# Patient Record
Sex: Male | Born: 1995
Health system: Southern US, Community
[De-identification: ages and names within clinical notes are randomized; demographics above are authoritative.]

## PROBLEM LIST (undated history)

## (undated) DIAGNOSIS — F329 Major depressive disorder, single episode, unspecified: Secondary | ICD-10-CM

## (undated) DIAGNOSIS — F32A Depression, unspecified: Secondary | ICD-10-CM

## (undated) DIAGNOSIS — F909 Attention-deficit hyperactivity disorder, unspecified type: Secondary | ICD-10-CM

## (undated) DIAGNOSIS — S0291XA Unspecified fracture of skull, initial encounter for closed fracture: Secondary | ICD-10-CM

## (undated) HISTORY — DX: Depression, unspecified: F32.A

## (undated) HISTORY — DX: Major depressive disorder, single episode, unspecified: F32.9

## (undated) HISTORY — DX: Unspecified fracture of skull, initial encounter for closed fracture: S02.91XA

---
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
## 2015-08-18 ENCOUNTER — Emergency Department (HOSPITAL_BASED_OUTPATIENT_CLINIC_OR_DEPARTMENT_OTHER): Payer: BLUE CROSS/BLUE SHIELD

## 2015-08-18 ENCOUNTER — Emergency Department (HOSPITAL_BASED_OUTPATIENT_CLINIC_OR_DEPARTMENT_OTHER)
Admission: EM | Admit: 2015-08-18 | Discharge: 2015-08-19 | Disposition: A | Discharge status: Home / Self Care | Payer: BLUE CROSS/BLUE SHIELD | Attending: Emergency Medicine | Admitting: Emergency Medicine

## 2015-08-18 ENCOUNTER — Encounter (HOSPITAL_BASED_OUTPATIENT_CLINIC_OR_DEPARTMENT_OTHER): Payer: Self-pay

## 2015-08-18 DIAGNOSIS — F909 Attention-deficit hyperactivity disorder, unspecified type: Secondary | ICD-10-CM | POA: Diagnosis not present

## 2015-08-18 DIAGNOSIS — M79672 Pain in left foot: Secondary | ICD-10-CM

## 2015-08-18 DIAGNOSIS — Z791 Long term (current) use of non-steroidal anti-inflammatories (NSAID): Secondary | ICD-10-CM | POA: Diagnosis not present

## 2015-08-18 DIAGNOSIS — M25572 Pain in left ankle and joints of left foot: Secondary | ICD-10-CM | POA: Diagnosis not present

## 2015-08-18 DIAGNOSIS — S99922A Unspecified injury of left foot, initial encounter: Secondary | ICD-10-CM | POA: Diagnosis not present

## 2015-08-18 DIAGNOSIS — S99912A Unspecified injury of left ankle, initial encounter: Secondary | ICD-10-CM | POA: Diagnosis not present

## 2015-08-18 MED ORDER — NAPROXEN 250 MG PO TABS
500.0000 mg | ORAL_TABLET | Freq: Once | ORAL | Status: AC
  Administered 2015-08-18: 500 mg via ORAL
  Filled 2015-08-18: qty 2
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
## 2015-08-18 NOTE — ED Notes (Signed)
Pt reports left ankle pain "off and on" for a while. Reports increasing in frequency. Pt reports torn ligament 4 years ago.

## 2015-08-18 NOTE — ED Notes (Signed)
MD at bedside. 

## 2015-08-19 MED ORDER — NAPROXEN 500 MG PO TABS: 500.0000 mg | ORAL_TABLET | Freq: Two times a day (BID) | ORAL | Status: DC

## 2015-08-19 MED FILL — NAPROXEN 500 MG TABLET: 500 | 15 days supply | Qty: 30 | Fill #0

## 2015-08-19 NOTE — ED Notes (Signed)
Pt given d/c instructions as per chart. Verbalizes understanding. No questions. Rx x 1 

## 2015-08-19 NOTE — Discharge Instructions (Signed)
Medications: Naproxen  Treatment: Take naproxen twice daily for your pain. Ice your heel 3-4 times daily alternating 20 minutes on, 20 minutes off. Use crutches as needed for comfort. Please be careful to not injure yourself again if you use her crutches.  Follow-up: Please follow-up with Dr. Pearletha ForgeHudnall for further evaluation and treatment of your pain. Please return to emergency department if you develop any new or worsening symptoms.

## 2015-09-17 ENCOUNTER — Other Ambulatory Visit: Payer: Self-pay | Admitting: Internal Medicine

## 2015-09-17 ENCOUNTER — Encounter: Payer: Self-pay | Admitting: Internal Medicine

## 2015-09-17 ENCOUNTER — Ambulatory Visit (INDEPENDENT_AMBULATORY_CARE_PROVIDER_SITE_OTHER): Payer: Managed Care, Other (non HMO) | Admitting: Internal Medicine

## 2015-09-17 VITALS — BP 106/66 | HR 66 | Temp 98.3°F | Ht 70.25 in | Wt 125.2 lb

## 2015-09-17 DIAGNOSIS — F329 Major depressive disorder, single episode, unspecified: Secondary | ICD-10-CM | POA: Diagnosis not present

## 2015-09-17 DIAGNOSIS — F32A Depression, unspecified: Secondary | ICD-10-CM

## 2015-09-17 NOTE — Progress Notes (Signed)
Pre visit review using our clinic review tool, if applicable. No additional management support is needed unless otherwise documented below in the visit note. 

## 2015-09-17 NOTE — Patient Instructions (Addendum)
Please go to the lab: Provide a urine sample  You need help within the next few days: Need to see a psychiatrist for medication Needs to find a counselor for long-term counseling Please call the [facility], see below You may also like to  contact some of the local psychiatrists.  [facility] [address], [place], [place] [address]  [phone]   If you have more suicidal ideas: go to the ER  If you are unable to get help within the next few days, please call the office

## 2015-09-17 NOTE — Progress Notes (Signed)
Subjective:    Patient ID: Seth Hill, male    DOB: 1995/05/04, 20 y.o.   MRN: [id number]  DOS:  09/17/2015 Type of visit - description : Acute visit, chief complaint depression; here with his father Interval history: The patient reports a long history of mild, on and off depression for a while, symptoms are definitely increasing for the last 3 months and particularly for the last few weeks. Feels down, like his life is not going anywhere . His ADD is untreated, has challenges with finances and opportunities to advance. Lost a friend to suicide few weeks ago, since then has been unable to sleep well. Father reports anger issues. When asked, admits to suicidal ideas on and off, some plans. Denies having acces to a gun, no suicidal thoughts in the last 24 hours. No   personal or family history of suicidal attempts  Review of Systems Denies  EtOH or drugs other than marijuana Has gained a few pounds lately No episodes of mania  Past Medical History  Diagnosis Date  . Attention deficit disorder with hyperactivity(314.01)   . Fracture, skull (HCC)     ~ 4-5 y/o    Past Surgical History  Procedure Laterality Date  . Mandible surgery  2008    jaw Fx      Social History   Social History  . Marital Status: Single    Spouse Name: N/A  . Number of Children: 0  . Years of Education: N/A   Occupational History  . works , Holiday representative, Marine scientist    Social History Main Topics  . Smoking status: Current Every Day Smoker  . Smokeless tobacco: Never Used     Comment: 1/3 ppd   . Alcohol Use: No     Comment: denies   . Drug Use: Yes     Comment: Marijuana, x3/week; denies other drugs  . Sexual Activity: Not on file   Other Topics Concern  . Not on file   Social History Narrative   Household-- F, M, B and sister    Did not finish HS, 11 grade only           Medication List       This list is accurate as of: 09/17/15 11:59 PM.  Always use your most recent med  list.               naproxen 500 MG tablet  Commonly known as:  NAPROSYN  Take 1 tablet (500 mg total) by mouth 2 (two) times daily.           Objective:   Physical Exam BP 106/66 mmHg  Pulse 66  Temp(Src) 98.3 F (36.8 C) (Oral)  Ht 5' 10.25" (1.784 m)  Wt 125 lb 4 oz (56.813 kg)  BMI 17.85 kg/m2  SpO2 94%  General:   Well developed, well nourished . NAD.  Neck: No  thyromegaly  HEENT:  Normocephalic . Face symmetric, atraumatic Lungs:  CTA B Normal respiratory effort, no intercostal retractions, no accessory muscle use. Heart: RRR,  no murmur.  No pretibial edema bilaterally  Abdomen:  Not distended, soft, non-tender. No rebound or rigidity.   Skin: Exposed areas without rash. Not pale. Not jaundice Neurologic:  alert & oriented X3.  Speech normal, gait appropriate for age and unassisted Strength symmetric and appropriate for age.  Psych: Cognition and judgment appear intact.  Cooperative with normal attention span and concentration.  Behavior appropriate. Only slightly anxious/depressed appearing. Certainly in no distress.  Assessment & Plan:   Assessment ADD with hyperactivity H/o Skull fracture, age ~ 20 y/o H/o lead poisoning   Plan: Severe depression: no bipolar features, untreated chronic ADD. The patient is clearly  depressed, PHQ-9 scored  24 which is confirmatory, he is also having suicidal thoughts. He needs prompt psychiatric care, contact numbers provided, I am somewhat reluctant to prescribe SSRIs due to the risk of increased suicidal ideas. Plan:  Urine sample for drug testing, to see a psychiatrist and a counselor ASAP. See instructions. If is unable to get prompt help, I'm willing to prescribe a low-dose of SSRI such as Lexapro 5 mg, the patient and his father who is here today, they are  aware of the increased risk of suicide w/ SSRIs and I was told he will be closely monitored by family . At some point he will need to address his  ADD.

## 2015-09-18 DIAGNOSIS — Z09 Encounter for follow-up examination after completed treatment for conditions other than malignant neoplasm: Secondary | ICD-10-CM | POA: Insufficient documentation

## 2015-09-18 NOTE — Assessment & Plan Note (Signed)
Severe depression: no bipolar features, untreated chronic ADD. The patient is clearly  depressed, PHQ-9 scored  24 which is confirmatory, he is also having suicidal thoughts. He needs prompt psychiatric care, contact numbers provided, I am somewhat reluctant to prescribe SSRIs due to the risk of increased suicidal ideas. Plan:  Urine sample for drug testing, to see a psychiatrist and a counselor ASAP. See instructions. If is unable to get prompt help, I'm willing to prescribe a low-dose of SSRI such as Lexapro 5 mg, the patient and his father who is here today, they are  aware of the increased risk of suicide w/ SSRIs and I was told he will be closely monitored by family . At some point he will need to address his ADD.

## 2015-09-21 LAB — PAIN MGMT, PROFILE 1 W/O CONF, U
Amphetamines: NEGATIVE ng/mL (ref <500)
BENZODIAZEPINES: NEGATIVE ng/mL (ref <100)
Barbiturates: NEGATIVE ng/mL (ref <300)
COCAINE METABOLITE: NEGATIVE ng/mL (ref <150)
CREATININE: 91.5 mg/dL (ref >=20.0)
MARIJUANA METABOLITE: POSITIVE ng/mL — AB (ref <20)
Methadone Metabolite: NEGATIVE ng/mL (ref <100)
OPIATES: NEGATIVE ng/mL (ref <100)
OXYCODONE: NEGATIVE ng/mL (ref <100)
Oxidant: NEGATIVE ug/mL (ref <200)
PHENCYCLIDINE: NEGATIVE ng/mL (ref <25)
pH: 6.97 (ref 4.5–9.0)

## 2015-10-08 ENCOUNTER — Telehealth: Payer: Self-pay | Admitting: Internal Medicine

## 2015-10-08 NOTE — Telephone Encounter (Signed)
thx

## 2015-10-08 NOTE — Telephone Encounter (Signed)
LMOM for Pt's father to return call at his convenience. Pt also has CPE scheduled for 10/09/2015 at 3:30PM.

## 2015-10-08 NOTE — Telephone Encounter (Signed)
Was recently seen with severe depression, please check on the patient: Was he able to see psychiatry? Is he doing better, worse? Let me know

## 2015-10-09 ENCOUNTER — Encounter: Payer: Self-pay | Admitting: Internal Medicine

## 2015-10-09 ENCOUNTER — Ambulatory Visit (INDEPENDENT_AMBULATORY_CARE_PROVIDER_SITE_OTHER): Payer: BLUE CROSS/BLUE SHIELD | Admitting: Internal Medicine

## 2015-10-09 VITALS — BP 108/72 | HR 75 | Temp 98.1°F | Resp 12 | Ht 70.0 in | Wt 126.0 lb

## 2015-10-09 DIAGNOSIS — Z114 Encounter for screening for human immunodeficiency virus [HIV]: Secondary | ICD-10-CM

## 2015-10-09 DIAGNOSIS — Z0000 Encounter for general adult medical examination without abnormal findings: Secondary | ICD-10-CM | POA: Diagnosis not present

## 2015-10-09 DIAGNOSIS — Z23 Encounter for immunization: Secondary | ICD-10-CM

## 2015-10-09 MED ORDER — ESCITALOPRAM OXALATE 10 MG PO TABS: 10.0000 mg | ORAL_TABLET | Freq: Every day | ORAL | 0 refills | Status: DC

## 2015-10-09 MED FILL — ESCITALOPRAM 10 MG TABLET: 10 | 30 days supply | Qty: 30 | Fill #0

## 2015-10-09 NOTE — Patient Instructions (Signed)
GO TO THE FRONT DESK Schedule your next appointment for a  routine checkup in 4 weeks  Schedule labs to be done within the next 2 days  Start Lexapro, one tablet every night. Watch for side effects including suicidal thoughts.   Safe Sex Safe sex is about reducing the risk of giving or getting a sexually transmitted disease (STD). STDs are spread through sexual contact involving the genitals, mouth, or rectum. Some STDs can be cured and others cannot. Safe sex can also prevent unintended pregnancies.  WHAT ARE SOME SAFE SEX PRACTICES?  Limit your sexual activity to only one partner who is having sex with only you.  Talk to your partner about his or her past partners, past STDs, and drug use.  Use a condom every time you have sexual intercourse. This includes vaginal, oral, and anal sexual activity. Both females and males should wear condoms during oral sex. Only use latex or polyurethane condoms and water-based lubricants. Using petroleum-based lubricants or oils to lubricate a condom will weaken the condom and increase the chance that it will break. The condom should be in place from the beginning to the end of sexual activity. Wearing a condom reduces, but does not completely eliminate, your risk of getting or giving an STD. STDs can be spread by contact with infected body fluids and skin.  Get vaccinated for hepatitis B and HPV.  Avoid alcohol and recreational drugs, which can affect your judgment. You may forget to use a condom or participate in high-risk sex.  For females, avoid douching after sexual intercourse. Douching can spread an infection farther into the reproductive tract.  Check your body for signs of sores, blisters, rashes, or unusual discharge. See your health care provider if you notice any of these signs.  Avoid sexual contact if you have symptoms of an infection or are being treated for an STD. If you or your partner has herpes, avoid sexual contact when blisters are  present. Use condoms at all other times.  If you are at risk of being infected with HIV, it is recommended that you take a prescription medicine daily to prevent HIV infection. This is called pre-exposure prophylaxis (PrEP). You are considered at risk if:  You are a man who has sex with other men (MSM).  You are a heterosexual man or woman who is sexually active with more than one partner.  You take drugs by injection.  You are sexually active with a partner who has HIV.  Talk with your health care provider about whether you are at high risk of being infected with HIV. If you choose to begin PrEP, you should first be tested for HIV. You should then be tested every 3 months for as long as you are taking PrEP.  See your health care provider for regular screenings, exams, and tests for other STDs. Before having sex with a new partner, each of you should be screened for STDs and should talk about the results with each other. WHAT ARE THE BENEFITS OF SAFE SEX?   There is less chance of getting or giving an STD.  You can prevent unwanted or unintended pregnancies.  By discussing safe sex concerns with your partner, you may increase feelings of intimacy, comfort, trust, and honesty between the two of you.   This information is not intended to replace advice given to you by your health care provider. Make sure you discuss any questions you have with your health care provider.   Document Released: 04/02/2004  Document Revised: 03/16/2014 Document Reviewed: 08/17/2011 Elsevier Interactive Patient Education [employer]     Testicular Self-Exam A self-examination of your testicles involves looking at and feeling your testicles for abnormal lumps or swelling. Several things can cause swelling, lumps, or pain in your testicles. Some of these causes are:  Injuries.  Inflammation.  Infection.  Accumulation of fluids around your testicle (hydrocele).  Twisted testicles (testicular  torsion).  Testicular cancer. Self-examination of the testicles and groin areas may be advised if you are at risk for testicular cancer. Risks for testicular cancer include:  An undescended testicle (cryptorchidism).  A history of previous testicular cancer.  A family history of testicular cancer. The testicles are easiest to examine after warm baths or showers and are more difficult to examine when you are cold. This is because the muscles attached to the testicles retract and pull them up higher or into the abdomen. Follow these steps while you are standing:  Hold your penis away from your body.  Roll one testicle between your thumb and forefinger, feeling the entire testicle.  Roll the other testicle between your thumb and forefinger, feeling the entire testicle. Feel for lumps, swelling, or discomfort. A normal testicle is egg shaped and feels firm. It is smooth and not tender. The spermatic cord can be felt as a firm spaghetti-like cord at the back of your testicle. It is also important to examine the crease between the front of your leg and your abdomen. Feel for any bumps that are tender. These could be enlarged lymph nodes.    This information is not intended to replace advice given to you by your health care provider. Make sure you discuss any questions you have with your health care provider.   Document Released: 06/01/2000 Document Revised: 10/26/2012 Document Reviewed: 08/15/2012 Elsevier Interactive Patient Education [employer]

## 2015-10-09 NOTE — Progress Notes (Signed)
Pre visit review using our clinic review tool, if applicable. No additional management support is needed unless otherwise documented below in the visit note. 

## 2015-10-09 NOTE — Assessment & Plan Note (Addendum)
Tdap 2014 Menactra and Bexero today Labs: Will come back in the morning for an FLP, TSH, HIV Counseled about: Diet, exercise, healthy eating, safe driving, safe sex, avoidance of tobacco- alcohol-drugs ; needs to see dentist regulalrly.

## 2015-10-09 NOTE — Progress Notes (Signed)
Subjective:    Patient ID: Seth Hill, male    DOB: 13-Jan-1996, 20 y.o.   MRN: [id number]  DOS:  10/09/2015 Type of visit - description : CPX Interval history: Since the last time he was here with severe depression, did not seek counseling or see a psychiatrist however he is doing better. No suicidal ideas lately. The father who is here confirms above, reports that he "got rid off a couple of people in his life" and make a huge difference   Review of Systems  Constitutional: No fever. No chills. No unexplained wt changes. No unusual sweats  HEENT: No dental problems, no ear discharge, no facial swelling, no voice changes. No eye discharge, no eye  redness , no  intolerance to light   Respiratory: No wheezing , no  difficulty breathing. No cough , no mucus production  Cardiovascular: No CP, no leg swelling , no  Palpitations  GI: no nausea, no vomiting, no diarrhea , no  abdominal pain.  No blood in the stools. No dysphagia, no odynophagia    Endocrine: No polyphagia, no polyuria , no polydipsia  GU: No dysuria, gross hematuria, difficulty urinating. No urinary urgency, no frequency.  Musculoskeletal: No joint swellings or unusual aches or pains  Skin: No change in the color of the skin, palor , no  Rash  Allergic, immunologic: No environmental allergies , no  food allergies  Neurological: No dizziness no  syncope. No headaches. No diplopia, no slurred, no slurred speech, no motor deficits, no facial  Numbness  Hematological: No enlarged lymph nodes, no easy bruising , no unusual bleedings  Psychiatry: No suicidal ideas, no hallucinations, no beavior problems, no confusion.   Past Medical History:  Diagnosis Date  . Attention deficit disorder with hyperactivity(314.01)   . Depression   . Fracture, skull (HCC)    ~ 4-5 y/o    Past Surgical History:  Procedure Laterality Date  . MANDIBLE SURGERY  2008   jaw Fx      Social History   Social History  .  Marital status: Single    Spouse name: N/A  . Number of children: 0  . Years of education: N/A   Occupational History  . looking for a job    Social History Main Topics  . Smoking status: Current Every Day Smoker  . Smokeless tobacco: Never Used     Comment: 1/3 ppd   . Alcohol use No     Comment: denies   . Drug use:      Comment: Marijuana, x3/week; nothingh layely ;denies other drugs  . Sexual activity: Not on file   Other Topics Concern  . Not on file   Social History Narrative   Household-- F, M, B and sister    Did not finish HS, 11 grade only        Family History  Problem Relation Age of Onset  . Macular degeneration Father   . Diabetes Father   . Hyperlipidemia Father   . Brain cancer Paternal Grandfather     brain tumor  . CAD Neg Hx        Medication List       Accurate as of 10/09/15 11:59 PM. Always use your most recent med list.          escitalopram 10 MG tablet Commonly known as:  LEXAPRO Take 1 tablet (10 mg total) by mouth daily.   naproxen 500 MG tablet Commonly known as:  NAPROSYN Take 1  tablet (500 mg total) by mouth 2 (two) times daily.          Objective:   Physical Exam BP 108/72 (BP Location: Left Arm, Patient Position: Sitting, Cuff Size: Small)   Pulse 75   Temp 98.1 F (36.7 C) (Oral)   Resp 12   Ht  (1.778 m)   Wt 126 lb (57.2 kg)   SpO2 97%   BMI 18.08 kg/m   General:   Well developed, mildly underweight but healthy-appearing . NAD.  Neck: No  thyromegaly  HEENT:  Normocephalic . Face symmetric, atraumatic Lungs:  CTA B Normal respiratory effort, no intercostal retractions, no accessory muscle use. Heart: RRR,  no murmur.  No pretibial edema bilaterally  Abdomen:  Not distended, soft, non-tender. No rebound or rigidity.   Skin: Exposed areas without rash. Not pale. Not jaundice Neurologic:  alert & oriented X3.  Speech normal, gait appropriate for age and unassisted Strength symmetric and  appropriate for age.  Psych: Cognition and judgment appear intact.  Cooperative with normal attention span and concentration.  Behavior appropriate. No anxious or depressed appearing. Much improved compared to the last time, he is smiling, back to  normal    Assessment & Plan:    Assessment ADD with hyperactivity (see note from 10-2015) H/o Skull fracture, age ~ 20 y/o H/o lead poisoning   PLAN: ADD: Long history of ADHD, treated with Ritalin when he was in first and second grade, parents d/c meds d/t weight loss. At this point he continue with sx, discussed  non-stimulant treatment, patient is quite hesitant. He will call if interested. Severe depression: improved, according to the father he  "got rid off a couple of people in his life" and that has helped. No suicidal ideas at this point. He self medicated with Lexapro 1 tablet and he subjectively felt better. I now feel comfortable prescribing SSRIs since he is not suicidal anymore, we agreed to start Lexapro, s/e discussed . RTC one month

## 2015-10-10 ENCOUNTER — Other Ambulatory Visit: Payer: Self-pay

## 2015-10-10 NOTE — Assessment & Plan Note (Signed)
ADD: Long history of ADHD, treated with Ritalin when he was in first and second grade, parents d/c meds d/t weight loss. At this point he continue with sx, discussed  non-stimulant treatment, patient is quite hesitant. He will call if interested. Severe depression: improved, according to the father he  "got rid off a couple of people in his life" and that has helped. No suicidal ideas at this point. He self medicated with Lexapro 1 tablet and he subjectively felt better. I now feel comfortable prescribing SSRIs since he is not suicidal anymore, we agreed to start Lexapro, s/e discussed . RTC one month

## 2015-10-11 ENCOUNTER — Other Ambulatory Visit: Payer: Managed Care, Other (non HMO)

## 2015-10-11 LAB — LIPID PANEL
Cholesterol: 166 mg/dL (ref 0–200)
HDL: 41.8 mg/dL (ref >39.00)
LDL Cholesterol: 107 mg/dL — ABNORMAL HIGH (ref 0–99)
NONHDL: 123.96
Total CHOL/HDL Ratio: 4
Triglycerides: 84 mg/dL (ref 0.0–149.0)
VLDL: 16.8 mg/dL (ref 0.0–40.0)

## 2015-10-11 LAB — TSH: TSH: 1.9 u[IU]/mL (ref 0.40–5.00)

## 2015-10-12 LAB — HIV ANTIBODY (ROUTINE TESTING W REFLEX): HIV 1&2 Ab, 4th Generation: NONREACTIVE

## 2015-10-29 ENCOUNTER — Telehealth: Payer: Self-pay

## 2015-10-29 NOTE — Telephone Encounter (Signed)
[facility] note received via fax  Call    Date:10/25/15 Time: 5:58:22pm   Caller:  Seth Hill (patient) Return number:  [phone]  Nurse:  Suzy BouchardBelinda Wells, RN  Chief Complaint:  Foot or Ankle Injury  Reason for call:  Caller is requesting Rx for Naproxen for ankle pain due to injury in which he has "torn ligaments" in right ankle.    Related visit to physician within the last 2 weeks:  n/a  Assessment type:  Verbal order/New medication order  Additional documentation:  Caller is requesting Rx for Naproxen for ankle pain due to injury in which he has "torn ligaments" in right ankle.  He last filled the prescription a month or more ago.  [facility] [place] [facility].    Enough medication until office visit:   No   Advised caller that the on call provider does not handle refills after hours.  Advised called that he can buy aleve OTC and take two of them twice daily and it is the same medication.  Advised caller that the nurse will send the message into the office for his prescription refill request.  Caller verbalized understanding.  Advised per [email or web address].      Message routed to PCP for review.  Ok to refill naproxen?  Please advise.

## 2015-10-30 MED ORDER — NAPROXEN 500 MG PO TABS: 500.0000 mg | ORAL_TABLET | Freq: Two times a day (BID) | ORAL | 0 refills | Status: DC

## 2015-10-30 NOTE — Telephone Encounter (Signed)
Okay #40, no refills, take with food to prevent  stomach irritation. If pain is persistent needs to be seen by sports medicine.

## 2015-10-30 NOTE — Telephone Encounter (Signed)
Rx sent 

## 2015-11-06 ENCOUNTER — Ambulatory Visit: Payer: Self-pay | Admitting: Internal Medicine

## 2015-11-06 DIAGNOSIS — Z0289 Encounter for other administrative examinations: Secondary | ICD-10-CM

## 2016-01-14 ENCOUNTER — Telehealth: Payer: Self-pay | Admitting: Internal Medicine

## 2016-01-14 MED ORDER — NAPROXEN 500 MG PO TABS: 500.0000 mg | ORAL_TABLET | Freq: Two times a day (BID) | ORAL | 0 refills | Status: DC

## 2016-01-14 NOTE — Telephone Encounter (Signed)
Rx sent 

## 2016-01-14 NOTE — Telephone Encounter (Signed)
Patient is requesting a refill of naproxen (NAPROSYN) 500 MG tablet Please advise   Pharmacy: [facility] [place], [place] - [id number] [address]

## 2016-01-27 MED FILL — NAPROXEN 500 MG TABLET: 500 | 20 days supply | Qty: 40 | Fill #0

## 2016-05-14 ENCOUNTER — Ambulatory Visit (INDEPENDENT_AMBULATORY_CARE_PROVIDER_SITE_OTHER): Payer: Self-pay | Admitting: Medical

## 2016-05-14 ENCOUNTER — Ambulatory Visit (HOSPITAL_BASED_OUTPATIENT_CLINIC_OR_DEPARTMENT_OTHER)
Admission: RE | Admit: 2016-05-14 | Discharge: 2016-05-14 | Disposition: A | Discharge status: Home / Self Care | Payer: Self-pay | Source: Ambulatory Visit | Attending: Medical | Admitting: Medical

## 2016-05-14 ENCOUNTER — Encounter: Payer: Self-pay | Admitting: Medical

## 2016-05-14 VITALS — BP 105/57 | HR 70 | Temp 98.2°F | Resp 16 | Ht 70.0 in | Wt 126.4 lb

## 2016-05-14 DIAGNOSIS — M25531 Pain in right wrist: Secondary | ICD-10-CM

## 2016-05-14 DIAGNOSIS — M79641 Pain in right hand: Secondary | ICD-10-CM

## 2016-05-14 DIAGNOSIS — M79631 Pain in right forearm: Secondary | ICD-10-CM | POA: Insufficient documentation

## 2016-05-14 MED ORDER — DICLOFENAC SODIUM 75 MG PO TBEC: 75.0000 mg | DELAYED_RELEASE_TABLET | Freq: Two times a day (BID) | ORAL | 0 refills | Status: DC

## 2016-05-14 NOTE — Progress Notes (Signed)
Pre visit review using our clinic review tool, if applicable. No additional management support is needed unless otherwise documented below in the visit note/SLS  

## 2016-05-14 NOTE — Progress Notes (Signed)
Subjective:    Patient ID: Seth Hill, male    DOB: 1995-11-23, 20 y.o.   MRN: [id number]  HPI  Pt in states he hurt his right wrist working on fender of vehicle. He states car rotor popped up and his wrist got twisted.   Pt is rt handed.  Accident was 2 days ago. Pt states no nsaids used. Dad gave him a gabapentin but did not help with pain.(discouraged use of others meds)     Review of Systems  Constitutional: Negative for chills, fatigue and fever.  Respiratory: Negative for cough, choking, chest tightness and wheezing.   Cardiovascular: Negative for palpitations.  Gastrointestinal: Negative for abdominal pain, constipation, diarrhea, nausea and vomiting.  Musculoskeletal: Negative for back pain.       Rt forearm, rt wrist and hand pain.  Skin: Negative for rash.  Neurological: Negative for dizziness and headaches.  Hematological: Negative for adenopathy. Does not bruise/bleed easily.  Psychiatric/Behavioral: Negative for behavioral problems and confusion.   Past Medical History:  Diagnosis Date  . Attention deficit disorder with hyperactivity(314.01)   . Depression   . Fracture, skull (HCC)    ~ 4-5 y/o     Social History   Social History  . Marital status: Single    Spouse name: N/A  . Number of children: 0  . Years of education: N/A   Occupational History  . looking for a job    Social History Main Topics  . Smoking status: Current Every Day Smoker  . Smokeless tobacco: Never Used     Comment: 1/3 ppd   . Alcohol use No     Comment: denies   . Drug use: Yes     Comment: Marijuana, x3/week; nothingh layely ;denies other drugs  . Sexual activity: Not on file   Other Topics Concern  . Not on file   Social History Narrative   Household-- F, M, B and sister    Did not finish HS, 11 grade only       Past Surgical History:  Procedure Laterality Date  . MANDIBLE SURGERY  2008   jaw Fx      Family History  Problem Relation Age of Onset    . Macular degeneration Father   . Diabetes Father   . Hyperlipidemia Father   . Brain cancer Paternal Grandfather     brain tumor  . CAD Neg Hx     No Known Allergies  Current Outpatient Prescriptions on File Prior to Visit  Medication Sig Dispense Refill  . escitalopram (LEXAPRO) 10 MG tablet Take 1 tablet (10 mg total) by mouth daily. 30 tablet 0   No current facility-administered medications on file prior to visit.     BP (!) 105/57 (BP Location: Left Arm, Patient Position: Sitting, Cuff Size: Normal)   Pulse 70   Temp 98.2 F (36.8 C) (Oral)   Resp 16   Ht 5\' 10"  (1.778 m)   Wt 126 lb 6 oz (57.3 kg)   SpO2 100%   BMI 18.13 kg/m      Objective:   Physical Exam  General- No acute distress. Pleasant patient.  Lungs- Clear, even and unlabored. Heart- regular rate and rhythm. Neurologic- CNII- XII grossly intact.  Rt forearm- dorsal aspect mild abrasion. Mid aspect tender and mild swollen. Rt wrist- dorsal wrist aspect tender.  Rt hand ventral aspect- mild abrasion 5th mettacarpal area. 5th metacarpal pain on palpation        Assessment & Plan:  [phone].  For your rt forearm pain, wrist pain and hand pain will get xrays today.  For pain and inflammation rx diclofenac.  Get wrist cock up splint and use daily.  Follow up in 7-10 days or as needed  Tidus Upchurch, Ramon DredgeEdward, [employer]F CorporationPA-C

## 2016-05-14 NOTE — Patient Instructions (Addendum)
For your rt forearm pain, wrist pain and hand pain will get xrays today.  For pain and inflammation rx diclofenac.  Get wrist cock up splint and use daily.  Follow up in 7-10 days or as needed  If no fracture and pain persisting then may need to refer to sports medicine   You could go back to work on Monday with restricted duty. Please update me if your work has restricted duty.

## 2016-05-22 MED FILL — DICLOFENAC SOD 75 MG TAB EC: 75 | 15 days supply | Qty: 30 | Fill #0

## 2016-06-29 ENCOUNTER — Encounter: Payer: Self-pay | Admitting: Medical

## 2016-06-29 ENCOUNTER — Ambulatory Visit (INDEPENDENT_AMBULATORY_CARE_PROVIDER_SITE_OTHER): Payer: Self-pay | Admitting: Medical

## 2016-06-29 VITALS — BP 105/67 | HR 88 | Temp 98.9°F | Ht 70.0 in | Wt 126.8 lb

## 2016-06-29 DIAGNOSIS — H6690 Otitis media, unspecified, unspecified ear: Secondary | ICD-10-CM

## 2016-06-29 DIAGNOSIS — J029 Acute pharyngitis, unspecified: Secondary | ICD-10-CM

## 2016-06-29 DIAGNOSIS — J301 Allergic rhinitis due to pollen: Secondary | ICD-10-CM

## 2016-06-29 LAB — POCT RAPID STREP A (OFFICE): Rapid Strep A Screen: NEGATIVE

## 2016-06-29 MED ORDER — FLUTICASONE PROPIONATE 50 MCG/ACT NA SUSP: 2.0000 | Freq: Every day | NASAL | 1 refills | Status: DC

## 2016-06-29 MED ORDER — AMOXICILLIN-POT CLAVULANATE 875-125 MG PO TABS: 1.0000 | ORAL_TABLET | Freq: Two times a day (BID) | ORAL | 0 refills | Status: DC

## 2016-06-29 MED ORDER — LEVOCETIRIZINE DIHYDROCHLORIDE 5 MG PO TABS: 5.0000 mg | ORAL_TABLET | Freq: Every evening | ORAL | 0 refills | Status: DC

## 2016-06-29 MED FILL — AMOX-CLAV 875-125 MG TABLET: 875-125 | 10 days supply | Qty: 20 | Fill #0

## 2016-06-29 NOTE — Progress Notes (Signed)
Subjective:    Patient ID: Seth Hill, male    DOB: [birth year], 21 y.o.   MRN: [id number]  HPI  Pt in with st since Saturday. Mild pain on Saturday. Worse pain on Sunday morning.(Sunday intermittent pain). Today constant sore throat.  Pt has had some nasal congestion with some sneezing. No itchy eyes.  No fever, no chills or sweats.(then states feels kinda warm at times).   When blows his nose some colored mucous.    Review of Systems  Constitutional: Positive for fever. Negative for chills and fatigue.       Maybe low grad subjective.  HENT: Positive for congestion and sore throat. Negative for ear pain, postnasal drip, sinus pain, sinus pressure, sneezing and trouble swallowing.        Pain swallowing.  Respiratory: Negative for cough, chest tightness, shortness of breath and wheezing.   Cardiovascular: Negative for chest pain and palpitations.  Gastrointestinal: Negative for abdominal pain.  Musculoskeletal: Negative for back pain and myalgias.  Skin: Negative for rash.  Neurological: Negative for dizziness, syncope, weakness and headaches.  Hematological: Negative for adenopathy. Does not bruise/bleed easily.  Psychiatric/Behavioral: Negative for behavioral problems and confusion.    Past Medical History:  Diagnosis Date  . Attention deficit disorder with hyperactivity(314.01)   . Depression   . Fracture, skull (HCC)    ~ 4-5 y/o     Social History   Social History  . Marital status: Single    Spouse name: N/A  . Number of children: 0  . Years of education: N/A   Occupational History  . looking for a job    Social History Main Topics  . Smoking status: Current Every Day Smoker  . Smokeless tobacco: Never Used     Comment: 1/3 ppd   . Alcohol use No     Comment: denies   . Drug use: Yes     Comment: Marijuana, x3/week; nothingh layely ;denies other drugs  . Sexual activity: Not on file   Other Topics Concern  . Not on file   Social History  Narrative   Household-- F, M, B and sister    Did not finish HS, 11 grade only       Past Surgical History:  Procedure Laterality Date  . MANDIBLE SURGERY  2008   jaw Fx      Family History  Problem Relation Age of Onset  . Macular degeneration Father   . Diabetes Father   . Hyperlipidemia Father   . Brain cancer Paternal Grandfather     brain tumor  . CAD Neg Hx     No Known Allergies  No current outpatient prescriptions on file prior to visit.   No current facility-administered medications on file prior to visit.     BP 105/67 (BP Location: Right Arm, Patient Position: Sitting, Cuff Size: Normal)   Pulse 88   Temp 98.9 F (37.2 C) (Oral)   Ht  (1.778 m)   Wt 126 lb 12.8 oz (57.5 kg)   BMI 18.19 kg/m       Objective:   Physical Exam  General  Mental Status - Alert. General Appearance - Well groomed. Not in acute distress.  Skin Rashes- No Rashes.  HEENT Head- Normal. Ear Auditory Canal - Left- Normal. Right - Normal.Tympanic Membrane- Left- Normal. Right- rt tm red Eye Sclera/Conjunctiva- Left- Normal. Right- Normal. Nose & Sinuses Nasal Mucosa- Left-  Boggy and Congested. Right-  Boggy and  Congested.Bilateral no maxillary  and no frontal sinus pressure. Mouth & Throat Lips: Upper Lip- Normal: no dryness, cracking, pallor, cyanosis, or vesicular eruption. Lower Lip-Normal: no dryness, cracking, pallor, cyanosis or vesicular eruption. Buccal Mucosa- Bilateral- No Aphthous ulcers. Oropharynx- No Discharge or Erythema. +pnd Tonsils: Characteristics- Bilateral-  Very bright  Erythema an mild Congestion. Size/Enlargement- Bilateral- No enlargement. Discharge- bilateral-None.  Neck Neck- Supple. No Masses.   Chest and Lung Exam Auscultation: Breath Sounds:-Clear even and unlabored.  Cardiovascular Auscultation:Rythm- Regular, rate and rhythm. Murmurs & Other Heart Sounds:Ausculatation of the heart reveal- No Murmurs.  Lymphatic Head &  Neck General Head & Neck Lymphatics: Bilateral: Description- moderate enlarged submandibular nodes       Assessment & Plan:  Your strep test was negative. However, your physical exam and clinical presentation is suspicious for strep and it is important to note that rapid strep test can be falsely negative. So I am going to give you augmentin  antibiotic today based on your exam and clinical presentation.  Augmentin has coverage for most ear infection. Your rt tm has dull infected appearance.  Some allergy symptoms as well. Will rx flonase for nasal congestion and xyzal.  Rest hydrate, tylenol for fever, and warm salt water gargles.   Follow up in 7 days or as needed.   Alexzia Kasler, Ramon Dredge, PA-C

## 2016-06-29 NOTE — Patient Instructions (Signed)
Your strep test was negative. However, your physical exam and clinical presentation is suspicious for strep and it is important to note that rapid strep test can be falsely negative. So I am going to give you augmentin  antibiotic today based on your exam and clinical presentation.  Augmentin has coverage for most ear infection. Your rt tm has dull infected appearance.  Some allergy symptoms as well. Will rx flonase for nasal congestion and xyzal.  Rest hydrate, tylenol for fever, and warm salt water gargles.   Follow up in 7 days or as needed.

## 2016-06-29 NOTE — Progress Notes (Signed)
Pre visit review using our clinic review tool, if applicable. No additional management support is needed unless otherwise documented below in the visit note. 

## 2016-11-18 ENCOUNTER — Encounter (HOSPITAL_BASED_OUTPATIENT_CLINIC_OR_DEPARTMENT_OTHER): Payer: Self-pay

## 2016-11-18 ENCOUNTER — Emergency Department (HOSPITAL_BASED_OUTPATIENT_CLINIC_OR_DEPARTMENT_OTHER)
Admission: EM | Admit: 2016-11-18 | Discharge: 2016-11-18 | Disposition: A | Discharge status: Home / Self Care | Payer: 59 | Attending: Emergency Medicine | Admitting: Emergency Medicine

## 2016-11-18 DIAGNOSIS — R05 Cough: Secondary | ICD-10-CM

## 2016-11-18 DIAGNOSIS — R0982 Postnasal drip: Secondary | ICD-10-CM

## 2016-11-18 DIAGNOSIS — F17210 Nicotine dependence, cigarettes, uncomplicated: Secondary | ICD-10-CM | POA: Insufficient documentation

## 2016-11-18 DIAGNOSIS — J029 Acute pharyngitis, unspecified: Secondary | ICD-10-CM

## 2016-11-18 DIAGNOSIS — R059 Cough, unspecified: Secondary | ICD-10-CM

## 2016-11-18 DIAGNOSIS — J069 Acute upper respiratory infection, unspecified: Secondary | ICD-10-CM | POA: Diagnosis not present

## 2016-11-18 LAB — RAPID STREP SCREEN (MED CTR MEBANE ONLY): STREPTOCOCCUS, GROUP A SCREEN (DIRECT): NEGATIVE

## 2016-11-18 NOTE — ED Provider Notes (Signed)
[facility]-EMERGENCY DEPT Provider Note   CSN: [id number] Arrival date & time: 11/18/16  1806     History   Chief Complaint Chief Complaint  Patient presents with  . Sore Throat    HPI Seth Hill is a 21 y.o. male who presents today with cough, sore throat that began yesterday afternoon. He reports that he does smoke about a third of a pack a day for 3 years.  Denies any fevers or chills, no shortness of breath. He does endorse nasal congestion and postnasal drainage.   HPI  Past Medical History:  Diagnosis Date  . Attention deficit disorder with hyperactivity(314.01)   . Depression   . Fracture, skull (HCC)    ~ 4-5 y/o    Patient Active Problem List   Diagnosis Date Noted  . PCP NOTES >>>>>>>>>>>>>> 09/18/2015  . Annual physical exam 12/13/2012  . ADHD (attention deficit hyperactivity disorder) 12/13/2012    Past Surgical History:  Procedure Laterality Date  . MANDIBLE SURGERY  2008   jaw Fx         Home Medications    Prior to Admission medications   Not on File    Family History Family History  Problem Relation Age of Onset  . Macular degeneration Father   . Diabetes Father   . Hyperlipidemia Father   . Brain cancer Paternal Grandfather        brain tumor  . CAD Neg Hx     Social History Social History  Substance Use Topics  . Smoking status: Current Every Day Smoker    Types: Cigarettes  . Smokeless tobacco: Never Used     Comment: 1/3 ppd   . Alcohol use No     Allergies   Patient has no known allergies.   Review of Systems Review of Systems  HENT: Positive for congestion, rhinorrhea and sore throat. Negative for ear pain and sinus pain.   Respiratory: Positive for cough. Negative for shortness of breath.      Physical Exam Updated Vital Signs BP 114/70 (BP Location: Left Arm)   Pulse 66   Temp 98.8 F (37.1 C) (Oral)   Resp 16   Ht  (1.854 m)   Wt 57.6 kg (127 lb)   SpO2 99%   BMI 16.76 kg/m   Physical  Exam  Constitutional: He appears well-developed and well-nourished.  HENT:  Head: Normocephalic and atraumatic.  Right Ear: Tympanic membrane, external ear and ear canal normal.  Left Ear: Tympanic membrane, external ear and ear canal normal.  Nose: Rhinorrhea present. Right sinus exhibits no maxillary sinus tenderness and no frontal sinus tenderness. Left sinus exhibits no maxillary sinus tenderness and no frontal sinus tenderness.  Mouth/Throat: Uvula is midline, oropharynx is clear and moist and mucous membranes are normal. No trismus in the jaw. No oropharyngeal exudate. No tonsillar exudate.  Cardiovascular: Normal rate and regular rhythm.   Pulmonary/Chest: Effort normal and breath sounds normal. No respiratory distress.  Lymphadenopathy:    He has no cervical adenopathy.  Neurological: He is alert.  Skin: Skin is warm and dry. He is not diaphoretic.  Nursing note and vitals reviewed.    ED Treatments / Results  Labs (all labs ordered are listed, but only abnormal results are displayed) Labs Reviewed  RAPID STREP SCREEN (NOT AT [facility])  CULTURE, GROUP A STREP [facility])    EKG  EKG Interpretation None       Radiology No results found.  Procedures Procedures (including critical care time)  Medications Ordered in ED Medications - No data to display   Initial Impression / Assessment and Plan / ED Course  I have reviewed the triage vital signs and the nursing notes.  Pertinent labs & imaging results that were available during my care of the patient were reviewed by me and considered in my medical decision making (see chart for details).    Patients symptoms are consistent with URI, likely viral etiology. Discussed that antibiotics are not indicated for viral infections. Pt will be discharged with symptomatic treatment.  Verbalizes understanding and is agreeable with plan. Pt is hemodynamically stable & in NAD prior to dc.   Final Clinical Impressions(s) / ED Diagnoses     Final diagnoses:  Sore throat  Cough  Post-nasal drainage  Upper respiratory tract infection, unspecified type    New Prescriptions There are no discharge medications for this patient.    Cristina GongHammond, Doyle Tegethoff W, Cordelia Poche-C 11/19/16 Jodelle Gross2001    Gold[place], Scott, MD 11/20/16 1248

## 2016-11-18 NOTE — ED Triage Notes (Signed)
C/o sore throat x 2 days-NAD-steady gait 

## 2016-11-18 NOTE — ED Notes (Signed)
Pt verbalizes understanding of d/c instructions and denies any further needs at this time. 

## 2016-11-18 NOTE — Discharge Instructions (Signed)

## 2016-11-21 LAB — CULTURE, GROUP A STREP (THRC)

## 2016-12-21 ENCOUNTER — Ambulatory Visit (INDEPENDENT_AMBULATORY_CARE_PROVIDER_SITE_OTHER): Payer: BLUE CROSS/BLUE SHIELD | Admitting: Internal Medicine

## 2016-12-21 ENCOUNTER — Encounter: Payer: Self-pay | Admitting: Internal Medicine

## 2016-12-21 VITALS — BP 118/76 | HR 66 | Temp 97.6°F | Resp 14 | Ht 70.0 in | Wt 132.1 lb

## 2016-12-21 DIAGNOSIS — K047 Periapical abscess without sinus: Secondary | ICD-10-CM

## 2016-12-21 MED ORDER — AMOXICILLIN-POT CLAVULANATE 875-125 MG PO TABS
1.0000 | ORAL_TABLET | Freq: Two times a day (BID) | ORAL | 0 refills | Status: DC
Start: 2016-12-21 — End: 2018-06-10

## 2016-12-21 NOTE — Assessment & Plan Note (Signed)
Dental infection: Right-sided jaw pain likely due to a dental infection, rx Augmentin, heating pad or ice, ibuprofen as needed, GI precautions discussed, see the dentist ASAP. See instructions.

## 2016-12-21 NOTE — Progress Notes (Signed)
   Subjective:    Patient ID: Seth Hill, male    DOB: Dec 02, 1995, 20 y.o.   MRN: [id number]  DOS:  12/21/2016 Type of visit - description :  Acute visit, here with his father Interval history: One-week history of right jaw pain, R side, under the jaw line. Pain increased for the last 2-3 days and now the whole area around the jaw on the right year is hurting History of bilateral jaw fracture with plate placement when he was  21 years old. He has some dental pain at the right lower denture.  Review of Systems No fever chills No classic sore throat from a URI. No fever chills Pain doesn't increase with chewing.  Past Medical History:  Diagnosis Date  . Attention deficit disorder with hyperactivity(314.01)   . Depression   . Fracture, skull (HCC)    ~ 4-5 y/o    Past Surgical History:  Procedure Laterality Date  . MANDIBLE SURGERY  2008   jaw Fx      Social History   Social History  . Marital status: Single    Spouse name: N/A  . Number of children: 0  . Years of education: N/A   Occupational History  . looking for a job    Social History Main Topics  . Smoking status: Current Every Day Smoker    Types: Cigarettes  . Smokeless tobacco: Never Used     Comment: 1/3 ppd   . Alcohol use No  . Drug use: Yes    Types: Marijuana  . Sexual activity: Not on file   Other Topics Concern  . Not on file   Social History Narrative   Household-- F, M, B and sister    Did not finish HS, 11 grade only         Allergies as of 12/21/2016   No Known Allergies     Medication List    as of 12/21/2016  9:06 AM   You have not been prescribed any medications.        Objective:   Physical Exam BP 118/76 (BP Location: Left Arm, Patient Position: Sitting, Cuff Size: Small)   Pulse 66   Temp 97.6 F (36.4 C) (Oral)   Resp 14   Ht  (1.778 m)   Wt 132 lb 2 oz (59.9 kg)   SpO2 97%   BMI 18.96 kg/m  General:   Well developed, well nourished . NAD.    HEENT:  Normocephalic . Face symmetric, atraumatic. TMs normal, nose not congested. TMJ: No click, nontender. Mouth: The posterior right lower molars are in very bad condition, no swelling of the gum or abscess but he certainly TTP around those 2 pieces. Throat symmetric Neck: Few submandibular LADs bilaterally, one of them at the right side is more than 1 cm  Skin: Not pale. Not jaundice Neurologic:  alert & oriented X3.  Speech normal, gait appropriate for age and unassisted Psych--  Cognition and judgment appear intact.  Cooperative with normal attention span and concentration.  Behavior appropriate. No anxious or depressed appearing.      Assessment & Plan:   Assessment ADD with hyperactivity (see note from 10-2015) H/o Skull fracture, age ~ 21 y/o H/o lead poisoning   PLAN: Dental infection: Right-sided jaw pain likely due to a dental infection, rx Augmentin, heating pad or ice, ibuprofen as needed, GI precautions discussed, see the dentist ASAP. See instructions.

## 2016-12-21 NOTE — Progress Notes (Signed)
Pre visit review using our clinic review tool, if applicable. No additional management support is needed unless otherwise documented below in the visit note. 

## 2016-12-21 NOTE — Patient Instructions (Signed)
Augmentin as prescribed for one week  See your dentist as soon as possible  Call anytime if severe symptoms, severe swelling.  IBUPROFEN (Advil or Motrin) 200 mg 2 tablets every 6 hours as needed for pain.  Always take it with food because may cause gastritis and ulcers.  If you notice nausea, stomach pain, change in the color of stools --->  Stop the medicine and let us know

## 2017-07-18 ENCOUNTER — Other Ambulatory Visit: Payer: Self-pay

## 2017-07-18 ENCOUNTER — Emergency Department (HOSPITAL_BASED_OUTPATIENT_CLINIC_OR_DEPARTMENT_OTHER): Payer: BLUE CROSS/BLUE SHIELD

## 2017-07-18 ENCOUNTER — Emergency Department (HOSPITAL_BASED_OUTPATIENT_CLINIC_OR_DEPARTMENT_OTHER)
Admission: EM | Admit: 2017-07-18 | Discharge: 2017-07-18 | Disposition: A | Discharge status: Home / Self Care | Payer: BLUE CROSS/BLUE SHIELD | Attending: Emergency Medicine | Admitting: Emergency Medicine

## 2017-07-18 ENCOUNTER — Encounter (HOSPITAL_BASED_OUTPATIENT_CLINIC_OR_DEPARTMENT_OTHER): Payer: Self-pay | Admitting: Emergency Medicine

## 2017-07-18 DIAGNOSIS — Y92007 Garden or yard of unspecified non-institutional (private) residence as the place of occurrence of the external cause: Secondary | ICD-10-CM | POA: Insufficient documentation

## 2017-07-18 DIAGNOSIS — F17210 Nicotine dependence, cigarettes, uncomplicated: Secondary | ICD-10-CM | POA: Diagnosis not present

## 2017-07-18 DIAGNOSIS — S99912A Unspecified injury of left ankle, initial encounter: Secondary | ICD-10-CM | POA: Diagnosis not present

## 2017-07-18 DIAGNOSIS — Y93H9 Activity, other involving exterior property and land maintenance, building and construction: Secondary | ICD-10-CM | POA: Insufficient documentation

## 2017-07-18 DIAGNOSIS — W1842XA Slipping, tripping and stumbling without falling due to stepping into hole or opening, initial encounter: Secondary | ICD-10-CM | POA: Insufficient documentation

## 2017-07-18 DIAGNOSIS — Y999 Unspecified external cause status: Secondary | ICD-10-CM | POA: Diagnosis not present

## 2017-07-18 DIAGNOSIS — S93402A Sprain of unspecified ligament of left ankle, initial encounter: Secondary | ICD-10-CM

## 2017-07-18 DIAGNOSIS — M7989 Other specified soft tissue disorders: Secondary | ICD-10-CM | POA: Diagnosis not present

## 2017-07-18 DIAGNOSIS — M791 Myalgia, unspecified site: Secondary | ICD-10-CM | POA: Diagnosis not present

## 2017-07-18 NOTE — ED Triage Notes (Signed)
Patient states that he was mowing his lawn and stepped in a whole. The patient reports that he "retore my ligament"

## 2017-07-18 NOTE — Discharge Instructions (Signed)
Ice and elevate to improve swelling! This is very important!  Alternate between Tylenol and Ibuprofen as needed for pain. Please follow up with the orthopedic group listed if your symptoms are not improving in another week or so.    TREATMENT  Rest, ice, elevation, and compression are the basic modes of treatment.    COLD THERAPY DIRECTIONS:  Ice or gel packs can be used to reduce both pain and swelling. Ice is the most helpful within the first 24 to 48 hours after an injury or flareup from overusing a muscle or joint.  Ice is effective, has very few side effects, and is safe for most people to use.   If you expose your skin to cold temperatures for too long or without the proper protection, you can damage your skin or nerves. Watch for signs of skin damage due to cold.   HOME CARE INSTRUCTIONS  Follow these tips to use ice and cold packs safely.  Place a dry or damp towel between the ice and skin. A damp towel will cool the skin more quickly, so you may need to shorten the time that the ice is used.  For a more rapid response, add gentle compression to the ice.  Ice for no more than 10 to 20 minutes at a time. The bonier the area you are icing, the less time it will take to get the benefits of ice.  Check your skin after 5 minutes to make sure there are no signs of a poor response to cold or skin damage.  Rest 20 minutes or more in between uses.  Once your skin is numb, you can end your treatment. You can test numbness by very lightly touching your skin. The touch should be so light that you do not see the skin dimple from the pressure of your fingertip. When using ice, most people will feel these normal sensations in this order: cold, burning, aching, and numbness.        Return to ER for new or worsening symptoms, any additional concerns.

## 2017-07-18 NOTE — ED Provider Notes (Signed)
[facility] EMERGENCY DEPARTMENT Provider Note   CSN: [id number] Arrival date & time: 07/18/17  1428     History   Chief Complaint Chief Complaint  Patient presents with  . Ankle Pain    HPI Seth Hill is a 22 y.o. male.  The history is provided by the patient and medical records. No language interpreter was used.  Ankle Pain   Pertinent negatives include no numbness.  Seth Hill is a 22 y.o. male who presents to the Emergency Department complaining of persistent left ankle pain x 3 days.  Patient states that he was mowing his lawn when he accidentally stepped in a hole.  His ankle twisted and he felt as if he tore a ligament.  He does have history of multiple ankle sprains in the past, but has never required any surgeries and has never had any broken bones.  He denies any numbness or tingling.  Associated with swelling and ecchymosis to the lateral side of the foot.  He has been able to walk on it, although this causes some discomfort.  He had some crutches at home and has been using those to stay off the foot-this is helped.    Past Medical History:  Diagnosis Date  . Attention deficit disorder with hyperactivity(314.01)   . Depression   . Fracture, skull (HCC)    ~ 4-5 y/o    Patient Active Problem List   Diagnosis Date Noted  . PCP NOTES >>>>>>>>>>>>>> 09/18/2015  . Annual physical exam 12/13/2012  . ADHD (attention deficit hyperactivity disorder) 12/13/2012    Past Surgical History:  Procedure Laterality Date  . MANDIBLE SURGERY  2008   jaw Fx         Home Medications    Prior to Admission medications   Medication Sig Start Date End Date Taking? Authorizing Provider  amoxicillin-clavulanate (AUGMENTIN) 875-125 MG tablet Take 1 tablet by mouth 2 (two) times daily. 12/21/16   Wanda Plump, MD    Family History Family History  Problem Relation Age of Onset  . Macular degeneration Father   . Diabetes Father   . Hyperlipidemia  Father   . Brain cancer Paternal Grandfather        brain tumor  . CAD Neg Hx     Social History Social History   Tobacco Use  . Smoking status: Current Every Day Smoker    Types: Cigarettes  . Smokeless tobacco: Never Used  . Tobacco comment: 1/3 ppd   Substance Use Topics  . Alcohol use: No    Alcohol/week: 0.0 oz  . Drug use: Yes    Types: Marijuana     Allergies   Patient has no known allergies.   Review of Systems Review of Systems  Musculoskeletal: Positive for arthralgias and myalgias.  Skin: Positive for color change (Ecchymosis). Negative for wound.  Neurological: Negative for weakness and numbness.     Physical Exam Updated Vital Signs BP 99/60 (BP Location: Right Arm)   Pulse 70   Temp 98.9 F (37.2 C) (Oral)   Resp 16   Ht  (1.854 m)   Wt 58.1 kg (128 lb)   SpO2 100%   BMI 16.89 kg/m   Physical Exam  Constitutional: He appears well-developed and well-nourished. No distress.  HENT:  Head: Normocephalic and atraumatic.  Neck: Neck supple.  Cardiovascular: Normal rate, regular rhythm and normal heart sounds.  No murmur heard. Pulmonary/Chest: Effort normal and breath sounds normal. No respiratory distress. He  has no wheezes. He has no rales.  Musculoskeletal:  Left ankle with tenderness to palpation of ATFL.  No tenderness to lateral or medial malleolus or fifth metatarsal area.  No navicular tenderness. + swelling and ecchymosis.  Bruising mostly pooling to the left lateral side of the foot. Full ROM. No break in skin. Achilles intact. Good pedal pulse and cap refill of toes.Sensation grossly intact.  Neurological: He is alert.  Skin: Skin is warm and dry.  Nursing note and vitals reviewed.    ED Treatments / Results  Labs (all labs ordered are listed, but only abnormal results are displayed) Labs Reviewed - No data to display  EKG None  Radiology Dg Ankle Complete Left  Result Date: 07/18/2017 CLINICAL DATA:  Left ankle injury  a few days ago. EXAM: LEFT ANKLE COMPLETE - 3+ VIEW COMPARISON:  None. FINDINGS: There is no evidence of fracture, dislocation, or joint effusion. There is no evidence of arthropathy or other focal bone abnormality. Minimal soft tissue swelling of lateral ankle. IMPRESSION: No acute fracture or dislocation. Minimal soft tissue swelling of lateral ankle. Electronically Signed   By: Sherian Rein M.D.   On: 07/18/2017 15:26    Procedures Procedures (including critical care time)  Medications Ordered in ED Medications - No data to display   Initial Impression / Assessment and Plan / ED Course  I have reviewed the triage vital signs and the nursing notes.  Pertinent labs & imaging results that were available during my care of the patient were reviewed by me and considered in my medical decision making (see chart for details).    Seth Hill is a 22 y.o. male who presents to ED for left ankle pain after resting his ankle while mowing the lawn a couple of days ago. LLE NVI. Exam c/w ankle sprain. X-ray negative for acute injury. ASO brace provided in ED. he already has crutches for home.  Home care instructions including RICE and NSAID's discussed. Follow up with ortho if symptoms not improving in 1 week. All questions answered.    Final Clinical Impressions(s) / ED Diagnoses   Final diagnoses:  Sprain of left ankle, unspecified ligament, initial encounter    ED Discharge Orders    None       Jomarion Mish, Chase Picket, PA-C 07/18/17 1741    Benjiman Core, MD 07/18/17 2357

## 2017-10-20 ENCOUNTER — Other Ambulatory Visit: Payer: Self-pay

## 2017-10-20 ENCOUNTER — Encounter (HOSPITAL_BASED_OUTPATIENT_CLINIC_OR_DEPARTMENT_OTHER): Payer: Self-pay | Admitting: Emergency Medicine

## 2017-10-20 ENCOUNTER — Emergency Department (HOSPITAL_BASED_OUTPATIENT_CLINIC_OR_DEPARTMENT_OTHER)
Admission: EM | Admit: 2017-10-20 | Discharge: 2017-10-20 | Disposition: A | Discharge status: Home / Self Care | Payer: BLUE CROSS/BLUE SHIELD | Attending: Emergency Medicine | Admitting: Emergency Medicine

## 2017-10-20 DIAGNOSIS — F17210 Nicotine dependence, cigarettes, uncomplicated: Secondary | ICD-10-CM | POA: Diagnosis not present

## 2017-10-20 DIAGNOSIS — M546 Pain in thoracic spine: Secondary | ICD-10-CM | POA: Insufficient documentation

## 2017-10-20 MED ORDER — NAPROXEN 500 MG PO TABS: 500.0000 mg | ORAL_TABLET | Freq: Two times a day (BID) | ORAL | 0 refills | Status: DC

## 2017-10-20 MED ORDER — CYCLOBENZAPRINE HCL 10 MG PO TABS: 10.0000 mg | ORAL_TABLET | Freq: Two times a day (BID) | ORAL | 0 refills | Status: DC | PRN

## 2017-10-20 MED FILL — CYCLOBENZAPRINE HCL 10 MG T: 10 | 10 days supply | Qty: 20 | Fill #0

## 2017-10-20 MED FILL — NAPROXEN 500 MG TABLET: 500 | 15 days supply | Qty: 30 | Fill #0

## 2017-10-20 NOTE — ED Notes (Signed)
Pt/family verbalized understanding of discharge instructions.   

## 2017-10-20 NOTE — ED Triage Notes (Signed)
Pt c/o mid- upper back pain, worse with movement x 2wks, but pain worse over the weekend

## 2017-10-20 NOTE — ED Provider Notes (Signed)
[facility] Emergency Department Provider Note MRN:  [id number]  Arrival date & time: 10/20/17     Chief Complaint   Back Pain   History of Present Illness   Seth Hill is a 22 y.o. year-old male with no pertinent past medical history presenting to the ED with chief complaint of back pain.  4 to 5 days of persistent right-sided thoracic back pain.  The pain is constant, made much worse with any twisting motion of the torso.  Patient denies any recent trauma.  Denies shortness of breath.  Review of Systems  A problem-focused ROS was performed. Positive for back pain.  Patient denies shortness of breath.    Patient's Health History    Past Medical History:  Diagnosis Date  . Attention deficit disorder with hyperactivity(314.01)   . Depression   . Fracture, skull (HCC)    ~ [age] y/o    Past Surgical History:  Procedure Laterality Date  . MANDIBLE SURGERY  2008   jaw Fx     Family History  Problem Relation Age of Onset  . Macular degeneration Father   . Diabetes Father   . Hyperlipidemia Father   . Brain cancer Paternal Grandfather        brain tumor  . CAD Neg Hx     Social History   Socioeconomic History  . Marital status: Single    Spouse name: Not on file  . Number of children: 0  . Years of education: Not on file  . Highest education level: Not on file  Occupational History  . Occupation: looking for a job  Social Needs  . Financial resource strain: Not on file  . Food insecurity:    Worry: Not on file    Inability: Not on file  . Transportation needs:    Medical: Not on file    Non-medical: Not on file  Tobacco Use  . Smoking status: Current Every Day Smoker    Types: Cigarettes  . Smokeless tobacco: Never Used  . Tobacco comment: 1/3 ppd   Substance and Sexual Activity  . Alcohol use: No    Alcohol/week: 0.0 standard drinks  . Drug use: Yes    Types: Marijuana  . Sexual activity: Not on file  Lifestyle  .  Physical activity:    Days per week: Not on file    Minutes per session: Not on file  . Stress: Not on file  Relationships  . Social connections:    Talks on phone: Not on file    Gets together: Not on file    Attends religious service: Not on file    Active member of club or organization: Not on file    Attends meetings of clubs or organizations: Not on file    Relationship status: Not on file  . Intimate partner violence:    Fear of current or ex partner: Not on file    Emotionally abused: Not on file    Physically abused: Not on file    Forced sexual activity: Not on file  Other Topics Concern  . Not on file  Social History Narrative   Household-- F, M, B and sister    Did not finish HS, 11 grade only     Physical Exam  Vital Signs and Nursing Notes reviewed Vitals:   10/20/17 0937  BP: 111/81  Pulse: 80  Resp: 18  Temp: 98.4 F (36.9 C)  SpO2: 100%    CONSTITUTIONAL: Well-appearing, NAD NEURO:  Alert and oriented x 3, no focal deficits EYES:  eyes equal and reactive ENT/NECK:  no LAD, no JVD CARDIO: Regular rate, well-perfused, normal S1 and S2 PULM:  CTAB no wheezing or rhonchi GI/GU:  normal bowel sounds, non-distended, non-tender MSK/SPINE:  No gross deformities, no edema; tenderness to palpation to the right latissimus dorsi SKIN:  no rash, atraumatic PSYCH:  Appropriate speech and behavior  Diagnostic and Interventional Summary    EKG Interpretation  Date/Time:    Ventricular Rate:    PR Interval:    QRS Duration:   QT Interval:    QTC Calculation:   R Axis:     Text Interpretation:        Labs Reviewed - No data to display  No orders to display    Medications - No data to display   Procedures Critical Care  ED Course and Medical Decision Making  I have reviewed the triage vital signs and the nursing notes.  Pertinent labs & imaging results that were available during my care of the patient were reviewed by me and considered in my  medical decision making (see below for details).    22 year old male here with back pain consistent with muscle strain of the latissimus dorsi based on significant tenderness on exam.  Good bilateral lung sounds, no trauma, little to no concern for spontaneous pneumothorax given exam and history of pain worsening with twisting motion.  Will give prescription for Naprosyn and Flexeril.  After the discussed management above, the patient was determined to be safe for discharge.  The patient was in agreement with this plan and all questions regarding their care were answered.  ED return precautions were discussed and the patient will return to the ED with any significant worsening of condition.  Elmer SowMichael M. Pilar PlateBero, MD [facility] [email or web address]  Final Clinical Impressions(s) / ED Diagnoses     ICD-10-CM   1. Acute right-sided thoracic back pain M54.6     ED Discharge Orders    None         Sabas SousBero, Reice Bienvenue M, MD 10/20/17 1009

## 2017-10-20 NOTE — Discharge Instructions (Signed)
You were evaluated in the Emergency Department and after careful evaluation, we did not find any emergent condition requiring admission or further testing in the hospital.  Your symptoms today seem to be due to muscle strain or spasm.  Please use the medications provided as directed.  The Flexeril can make you sleepy, do not take when driving or performing other similar tasks.  Please return to the Emergency Department if you experience any worsening of your condition or shortness of breath.  We encourage you to follow up with a primary care provider.  Thank you for allowing us to be a part of your care.

## 2018-06-10 ENCOUNTER — Ambulatory Visit (INDEPENDENT_AMBULATORY_CARE_PROVIDER_SITE_OTHER): Payer: BLUE CROSS/BLUE SHIELD | Admitting: Internal Medicine

## 2018-06-10 ENCOUNTER — Ambulatory Visit: Payer: Self-pay

## 2018-06-10 ENCOUNTER — Other Ambulatory Visit: Payer: Self-pay

## 2018-06-10 DIAGNOSIS — R197 Diarrhea, unspecified: Secondary | ICD-10-CM | POA: Diagnosis not present

## 2018-06-10 DIAGNOSIS — R6883 Chills (without fever): Secondary | ICD-10-CM

## 2018-06-10 DIAGNOSIS — Z20822 Contact with and (suspected) exposure to covid-19: Secondary | ICD-10-CM

## 2018-06-10 DIAGNOSIS — Z20828 Contact with and (suspected) exposure to other viral communicable diseases: Secondary | ICD-10-CM | POA: Diagnosis not present

## 2018-06-10 NOTE — Telephone Encounter (Signed)
Pt. Called to report he was exposed to COVID 19.  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  The pt. Is unaware if he came in direct contact with the person who was confirmed to have COVID 19.  Denied any cough or shortness of breath.  C/o nausea, acid reflux, stomach bubbling, and diarfhea since Monday, 3/30.  Denied vomiting.  Reported intermittent chills.  Stated he hasn't checked a temperature, but his mother and girlfriend told him he feels feverish.  Advised will schedule virtual appt. with PCP for evaluation of symptoms.  Called FC at office; she intercepted the call to schedule pt's appt.     Reason for Disposition . [1] Mild body aches, chills, diarrhea, headache, runny nose, or sore throat AND [2] within 14 days of COVID-Exposure  Answer Assessment - Initial Assessment Questions 1. CLOSE CONTACT: "Who is the person with the confirmed or suspected COVID-19 infection that you were exposed to?"     An employee at [employer]  2. PLACE of CONTACT: "Where were you when you were exposed to COVID-19?" (e.g., home, school, medical waiting room; which city?)     Gas station  3. TYPE of CONTACT: "How much contact was there?" (e.g., sitting next to, live in same house, work in same office, same building)     unsure 4. DURATION of CONTACT: "How long were you in contact with the COVID-19 patient?" (e.g., a few seconds, passed by person, a few minutes, live with the patient)     Unsure about actual close contact  5. DATE of CONTACT: "When did you have contact with a COVID-19 patient?" (e.g., how many days ago)     06/09/18 6.TRAVEL: "Have you traveled out of the country recently?" If so, "When and where?"     * Also ask about out-of-state travel, since the CDC has identified some high risk cities for community spread in the [place].     * Note: Travel becomes less relevant if there is  widespread community transmission where the patient lives.    Traveled to [place] about one month ago  7. COMMUNITY SPREAD: "Are there lots of cases or COVID-19 (community spread) where you live?" (See public health department website, if unsure)   * MAJOR community spread: high number of cases; numbers of cases are increasing; many people hospitalized.   * MINOR community spread: low number of cases; not increasing; few or no people hospitalized    Minor 8. SYMPTOMS: "Do you have any symptoms?" (e.g., fever, cough, breathing difficulty)     C/o nausea and stomach gurgling, acid reflux, alternating feeling hot and cold, and diarrhea (4-5/day) since 3/30;  Denied cough or shortness of breath 9. PREGNANCY OR POSTPARTUM: "Is there any chance you are pregnant?" "When was your last menstrual period?" "Did you deliver in the last 2 weeks?"     N/a  10. HIGH RISK: "Do you have any heart or lung problems? Do you have a weak immune system?" (e.g., CHF, COPD, asthma, HIV positive, chemotherapy, renal failure, diabetes mellitus, sickle cell anemia)      Denied the above  Protocols used: CORONAVIRUS (COVID-19) EXPOSURE-A-AH

## 2018-06-10 NOTE — Telephone Encounter (Signed)
FYI. Upcoming virtual visit this afternoon.

## 2018-06-10 NOTE — Progress Notes (Signed)
Subjective:    Patient ID: Seth Hill, male    DOB: [birth year], 23 y.o.   MRN: [id number]  DOS:  06/10/2018 Type of visit - description: Virtual Visit via Video Note  I connected with@ on 06/10/18 at  2:40 PM EDT by a video enabled telemedicine application and verified that I am speaking with the correct person using two identifiers.   THIS ENCOUNTER IS A VIRTUAL VISIT DUE TO COVID-19 - PATIENT WAS NOT SEEN IN THE OFFICE. PATIENT HAS CONSENTED TO VIRTUAL VISIT / TELEMEDICINE VISIT   Location of patient: home  Location of provider: office  I discussed the limitations of evaluation and management by telemedicine and the availability of in person appointments. The patient expressed understanding and agreed to proceed.  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 Seth Hill  started with some symptoms 4 days ago: Occasional chills, stomach "rumbling", some heartburn.  Some nausea. He also had diarrhea 3 or 4 times a day for the last few days.  No blood in the stools. He is also very concerned about his father who has a number of medical issues.   Review of Systems Denies subjective fever but has not been checking his temperature. No chest pain, difficulty breathing or cough No myalgias No headaches   Past Medical History:  Diagnosis Date  . Attention deficit disorder with hyperactivity(314.01)   . Depression   . Fracture, skull (HCC)    ~ [age] y/o    Past Surgical History:  Procedure Laterality Date  . MANDIBLE SURGERY  2008   jaw Fx     Social History   Socioeconomic History  . Marital status: Single    Spouse name: Not on file  . Number of children: 0  . Years of education: Not on file  . Highest education level: Not on file  Occupational History  . Occupation: looking for a job  Social Needs  . Financial resource strain: Not on file  .  Food insecurity:    Worry: Not on file    Inability: Not on file  . Transportation needs:    Medical: Not on file    Non-medical: Not on file  Tobacco Use  . Smoking status: Current Every Day Smoker    Types: Cigarettes  . Smokeless tobacco: Never Used  . Tobacco comment: 1/3 ppd   Substance and Sexual Activity  . Alcohol use: No    Alcohol/week: 0.0 standard drinks  . Drug use: Yes    Types: Marijuana  . Sexual activity: Not on file  Lifestyle  . Physical activity:    Days per week: Not on file    Minutes per session: Not on file  . Stress: Not on file  Relationships  . Social connections:    Talks on phone: Not on file    Gets together: Not on file    Attends religious service: Not on file    Active member of club or organization: Not on file    Attends meetings of clubs or organizations: Not on file    Relationship status: Not on file  . Intimate partner violence:    Fear of current or ex partner: Not on file    Emotionally abused: Not on file    Physically abused: Not on file    Forced sexual activity: Not on file  Other Topics Concern  .  Not on file  Social History Narrative   Household-- F, M, B and sister    Did not finish HS, 11 grade only      Allergies as of 06/10/2018   No Known Allergies     Medication List       Accurate as of June 10, 2018  2:44 PM. Always use your most recent med list.        amoxicillin-clavulanate 875-125 MG tablet Commonly known as:  Augmentin Take 1 tablet by mouth 2 (two) times daily.   cyclobenzaprine 10 MG tablet Commonly known as:  FLEXERIL Take 1 tablet (10 mg total) by mouth 2 (two) times daily as needed for muscle spasms.   naproxen 500 MG tablet Commonly known as:  NAPROSYN Take 1 tablet (500 mg total) by mouth 2 (two) times daily.           Objective:   Physical Exam There were no vitals taken for this visit. This is our video conference, he looks healthy, alert and oriented x3    Assessment      Assessment ADD with hyperactivity (see note from 10-2015) H/o Skull fracture, age ~ 22 y/o H/o lead poisoning    Plan: Exposed to COVID-19, chills, diarrhea: The patient has been apparently exposed to coronavirus, he has developed some symptoms so I advised patient that he needs to consider himself sick with possibly coronavirus and he needs to quarantine himself. He lives with his father who has a number of medical conditions, he is also my patient. I recommend to avoid with his father as much as possible, frequent handwashing, they are ready getting supplies to clean the house. He also needs to use a mask. Also recommend Tylenol, good hydration, Pepto-Bismol for diarrhea. Recommend thermometer to measure his temperature Recommend to visit the CDC website to get even more information about his condition. Go to the ER or call if high fever, chills, cough, chest pain or difficulty breathing. He does not need to go to work, will call and let us know if we need to send a letter to his employer. He will stop being contagious 2 weeks after the onset of the symptoms per guidelines; if he ended up developing fever, then he will be ok 3 days after the last fever without taking antipyretics. Again encouraged him to follow above recommendations and visit the [facility] website for more detailed information. He verbalized understanding.   I discussed the assessment and treatment plan with the patient. The patient was provided an opportunity to ask questions and all were answered. The patient agreed with the plan and demonstrated an understanding of the instructions.   The patient was advised to call back or seek an in-person evaluation if the symptoms worsen or if the condition fails to improve as anticipated.

## 2018-06-13 ENCOUNTER — Telehealth: Payer: Self-pay

## 2018-06-13 NOTE — Telephone Encounter (Signed)
Needs to be excused  2 weeks from the onset of symptoms, symptoms started 06/07/2018. If he has fever he is to let us know

## 2018-06-13 NOTE — Assessment & Plan Note (Signed)
Exposed to COVID-19, chills, diarrhea: The patient has been apparently exposed to coronavirus, he has developed some symptoms so I advised patient that he needs to consider himself sick with possibly coronavirus and he needs to quarantine himself. He lives with his father who has a number of medical conditions, he is also my patient. I recommend to avoid with his father as much as possible, frequent handwashing, they are ready getting supplies to clean the house. He also needs to use a mask. Also recommend Tylenol, good hydration, Pepto-Bismol for diarrhea. Recommend thermometer to measure his temperature Recommend to visit the CDC website to get even more information about his condition. Go to the ER or call if high fever, chills, cough, chest pain or difficulty breathing. He does not need to go to work, will call and let us know if we need to send a letter to his employer. He will stop being contagious 2 weeks after the onset of the symptoms per guidelines; if he ended up developing fever, then he will be ok 3 days after the last fever without taking antipyretics. Again encouraged him to follow above recommendations and visit the [facility] website for more detailed information. He verbalized understanding.

## 2018-06-13 NOTE — Telephone Encounter (Signed)
Please advise regarding work note and for how long Pt needs to be out of work?

## 2018-06-13 NOTE — Telephone Encounter (Signed)
Work note faxed to number provided. Spoke w/ Pt's father, Caryn Bee, informed that work note has been sent.

## 2018-06-13 NOTE — Telephone Encounter (Signed)
Copied from CRM [id number]. Topic: General - Other >> Jun 13, 2018 12:58 PM Jaquita Rector A wrote: Reason for CRM: Patient called to say that note from Dr Drue Novel need to be faxed to his employers at information provided.  [employer]. Fax# [phone] Att. John and Greta Doom Any questions patient can be reached at Ph# [phone]

## 2018-06-21 ENCOUNTER — Telehealth: Payer: Self-pay

## 2018-06-21 NOTE — Telephone Encounter (Signed)
Copied from CRM [id number]. Topic: General - Other >> Jun 21, 2018 12:26 PM Jaquita Rector A wrote: Reason for CRM: Patient LMOM that his employer received the note for his absence from work. Per patient he never received a date to return to work he is asking for a call back with that information please. Ph# [phone]

## 2018-06-21 NOTE — Telephone Encounter (Signed)
Needs virtual visit for PCP to assess when Pt can return to work. Please schedule at his convenience.

## 2018-08-23 ENCOUNTER — Telehealth: Payer: Self-pay | Admitting: *Deleted

## 2018-08-23 ENCOUNTER — Ambulatory Visit (INDEPENDENT_AMBULATORY_CARE_PROVIDER_SITE_OTHER): Payer: BC Managed Care – PPO | Admitting: Internal Medicine

## 2018-08-23 DIAGNOSIS — Z20822 Contact with and (suspected) exposure to covid-19: Secondary | ICD-10-CM

## 2018-08-23 DIAGNOSIS — R197 Diarrhea, unspecified: Secondary | ICD-10-CM | POA: Diagnosis not present

## 2018-08-23 MED ORDER — ONDANSETRON HCL 4 MG PO TABS: 4.0000 mg | ORAL_TABLET | Freq: Three times a day (TID) | ORAL | 0 refills | Status: DC | PRN

## 2018-08-23 NOTE — Telephone Encounter (Signed)
-----   Message from Colon Branch, MD sent at 08/23/2018  4:25 PM EDT ----- Regarding: Needs COVID-19 testing, has diarrhea, see my note.

## 2018-08-23 NOTE — Progress Notes (Signed)
Subjective:    Patient ID: Seth Hill, male    DOB: Nov 15, 1995, 23 y.o.   MRN: [id number]  DOS:  08/23/2018 Type of visit - description: Acute visit He was seen with GI symptoms 06/10/2018, those symptoms quickly resolved and he went back to normal until yesterday when he started to feel unwell again.  + Nausea, some vomiting.  Nausea increases after eating. He had on and off subjective fever, he checked his temperature one time: 100.3 He has diffuse abdominal pain, described as cramps, decrease after a bowel movement. No sick contacts Has not eating anything unusual.  Review of Systems Denies runny nose, sore throat or cough No chest pain no difficulty breathing Appetite is decreased but he is forcing fluids No headaches, no rash Stools are not watery but loose.  Nonbloody.  Past Medical History:  Diagnosis Date  . Attention deficit disorder with hyperactivity(314.01)   . Depression   . Fracture, skull ([place])    ~ [age] y/o    Past Surgical History:  Procedure Laterality Date  . MANDIBLE SURGERY  2008   jaw Fx     Social History   Socioeconomic History  . Marital status: Single    Spouse name: Not on file  . Number of children: 0  . Years of education: Not on file  . Highest education level: Not on file  Occupational History  . Occupation: looking for a job  Social Needs  . Financial resource strain: Not on file  . Food insecurity    Worry: Not on file    Inability: Not on file  . Transportation needs    Medical: Not on file    Non-medical: Not on file  Tobacco Use  . Smoking status: Current Every Day Smoker    Types: Cigarettes  . Smokeless tobacco: Never Used  . Tobacco comment: 1/3 ppd   Substance and Sexual Activity  . Alcohol use: No    Alcohol/week: 0.0 standard drinks  . Drug use: Yes    Types: Marijuana  . Sexual activity: Not on file  Lifestyle  . Physical activity    Days per week: Not on file    Minutes per session: Not on file  .  Stress: Not on file  Relationships  . Social Herbalist on phone: Not on file    Gets together: Not on file    Attends religious service: Not on file    Active member of club or organization: Not on file    Attends meetings of clubs or organizations: Not on file    Relationship status: Not on file  . Intimate partner violence    Fear of current or ex partner: Not on file    Emotionally abused: Not on file    Physically abused: Not on file    Forced sexual activity: Not on file  Other Topics Concern  . Not on file  Social History Narrative   Household-- F, M, B and sister    Did not finish HS, 11 grade only      Allergies as of 08/23/2018   No Known Allergies     Medication List    as of August 23, 2018  4:12 PM   You have not been prescribed any medications.         Objective:   Physical Exam There were no vitals taken for this visit. This is a virtual video visit, patient is alert oriented x3, no apparent distress, walking  around his house without problems,  speech is fluent.    Assessment      Assessment ADD with hyperactivity (see note from 10-2015) H/o Skull fracture, age ~ 23 y/o H/o lead poisoning    Plan: Acute diarrhea: as described above, recommend the following: Drink plenty fluids including Gatorade, soup and a bland diet Pepto-Bismol as needed Rx for Zofran sent Tylenol for fever Call if not gradually better, ER if severe symptoms specifically blood in the stools or intense abdominal pain.  Work note provided. COVID-19: GI symptoms could represent COVID infection, will send him to be tested.

## 2018-08-23 NOTE — Telephone Encounter (Signed)
Patient called and scheduled for testing at [place] on 08/24/18. Pt advised to wear a mask and remain in car at appt time. Pt states he does not have a mask to wear to appt. Pt advised that he could use some type of cloth/fabric covering such as a bandana to cover his mouth and nose if he did not have a mask. Pt verbalized understanding.

## 2018-08-24 ENCOUNTER — Encounter: Payer: Self-pay | Admitting: Internal Medicine

## 2018-08-24 ENCOUNTER — Other Ambulatory Visit: Payer: BC Managed Care – PPO

## 2018-08-24 DIAGNOSIS — Z20822 Contact with and (suspected) exposure to covid-19: Secondary | ICD-10-CM

## 2018-08-24 DIAGNOSIS — R6889 Other general symptoms and signs: Secondary | ICD-10-CM | POA: Diagnosis not present

## 2018-08-25 NOTE — Assessment & Plan Note (Signed)
Acute diarrhea: as described above, recommend the following: Drink plenty fluids including Gatorade, soup and a bland diet Pepto-Bismol as needed Rx for Zofran sent Tylenol for fever Call if not gradually better, ER if severe symptoms specifically blood in the stools or intense abdominal pain.  Work note provided. COVID-19: GI symptoms could represent COVID infection, will send him to be tested.

## 2018-08-27 LAB — NOVEL CORONAVIRUS, NAA: SARS-CoV-2, NAA: NOT DETECTED

## 2019-02-24 ENCOUNTER — Other Ambulatory Visit: Payer: Self-pay

## 2019-02-27 ENCOUNTER — Encounter: Payer: Self-pay | Admitting: Internal Medicine

## 2019-02-27 ENCOUNTER — Ambulatory Visit (INDEPENDENT_AMBULATORY_CARE_PROVIDER_SITE_OTHER): Payer: Self-pay | Admitting: Internal Medicine

## 2019-02-27 ENCOUNTER — Ambulatory Visit (HOSPITAL_BASED_OUTPATIENT_CLINIC_OR_DEPARTMENT_OTHER)
Admission: RE | Admit: 2019-02-27 | Discharge: 2019-02-27 | Disposition: A | Discharge status: Home / Self Care | Payer: BC Managed Care – PPO | Source: Ambulatory Visit | Attending: Internal Medicine | Admitting: Internal Medicine

## 2019-02-27 ENCOUNTER — Ambulatory Visit: Payer: BC Managed Care – PPO

## 2019-02-27 ENCOUNTER — Other Ambulatory Visit: Payer: Self-pay

## 2019-02-27 VITALS — BP 105/77 | HR 77 | Temp 97.1°F | Resp 18 | Ht 70.0 in | Wt 133.4 lb

## 2019-02-27 DIAGNOSIS — R05 Cough: Secondary | ICD-10-CM | POA: Diagnosis not present

## 2019-02-27 DIAGNOSIS — J019 Acute sinusitis, unspecified: Secondary | ICD-10-CM

## 2019-02-27 DIAGNOSIS — Z20822 Contact with and (suspected) exposure to covid-19: Secondary | ICD-10-CM

## 2019-02-27 DIAGNOSIS — R059 Cough, unspecified: Secondary | ICD-10-CM

## 2019-02-27 DIAGNOSIS — J40 Bronchitis, not specified as acute or chronic: Secondary | ICD-10-CM

## 2019-02-27 MED ORDER — AZITHROMYCIN 250 MG PO TABS: ORAL_TABLET | ORAL | 0 refills | Status: DC

## 2019-02-27 MED FILL — AZITHROMYCIN 250 MG TABLET: 250 | 5 days supply | Qty: 6 | Fill #0

## 2019-02-27 NOTE — Progress Notes (Signed)
Subjective:    Patient ID: Seth Hill, male    DOB: July 04, 1995, 23 y.o.   MRN: [id number]  DOS:  02/27/2019 Type of visit - description: Acute 2 weeks ago he got "cold": Headache, runny nose, some cough, never did run a fever. Symptoms essentially resolved within few days. 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  Also, for the last 2-1/2 days is having headache: Located at the right temple right jaw. Denies any dental pain No rash Slightly worse when he eats or drinks? He still has a lingering cough.  Review of Systems  No weight loss Mild sinus pain and nasal discharge No chest pain no difficulty breathing No nausea, vomiting, diarrhea No loss of taste or smell  Past Medical History:  Diagnosis Date  . Attention deficit disorder with hyperactivity(314.01)   . Depression   . Fracture, skull ([place])    ~ 4-5 y/o    Past Surgical History:  Procedure Laterality Date  . MANDIBLE SURGERY  2008   jaw Fx     Social History   Socioeconomic History  . Marital status: Single    Spouse name: Not on file  . Number of children: 0  . Years of education: Not on file  . Highest education level: Not on file  Occupational History  . Occupation: looking for a job  Tobacco Use  . Smoking status: Current Every Day Smoker    Types: Cigarettes  . Smokeless tobacco: Never Used  . Tobacco comment: 1/3 ppd   Substance and Sexual Activity  . Alcohol use: No    Alcohol/week: 0.0 standard drinks  . Drug use: Yes    Types: Marijuana  . Sexual activity: Not on file  Other Topics Concern  . Not on file  Social History Narrative   Household-- F, M, B and sister    Did not finish HS, 11 grade only   Social Determinants of Health   Financial Resource Strain:   . Difficulty of Paying Living Expenses: Not on file  Food Insecurity:   . Worried About Charity fundraiser in the Last Year: Not on file  . Ran Out of Food in the Last Year: Not on file    Transportation Needs:   . Lack of Transportation (Medical): Not on file  . Lack of Transportation (Non-Medical): Not on file  Physical Activity:   . Days of Exercise per Week: Not on file  . Minutes of Exercise per Session: Not on file  Stress:   . Feeling of Stress : Not on file  Social Connections:   . Frequency of Communication with Friends and Family: Not on file  . Frequency of Social Gatherings with Friends and Family: Not on file  . Attends Religious Services: Not on file  . Active Member of Clubs or Organizations: Not on file  . Attends Archivist Meetings: Not on file  . Marital Status: Not on file  Intimate Partner Violence:   . Fear of Current or Ex-Partner: Not on file  . Emotionally Abused: Not on file  . Physically Abused: Not on file  . Sexually Abused: Not on file      Allergies as of 02/27/2019   No Known Allergies     Medication List       Accurate as of February 27, 2019 11:14 AM. If you have any questions, ask your nurse or doctor.        ondansetron 4  MG tablet Commonly known as: ZOFRAN Take 1 tablet (4 mg total) by mouth every 8 (eight) hours as needed for nausea or vomiting.           Objective:   Physical Exam BP 105/77 (BP Location: Right Arm, Patient Position: Sitting, Cuff Size: Small)   Pulse 77   Temp (!) 97.1 F (36.2 C) (Temporal)   Resp 18   Ht 5\' 10"  (1.778 m)   Wt 133 lb 6 oz (60.5 kg)   SpO2 94%   BMI 19.14 kg/m  General:   Well developed, NAD, BMI noted. HEENT:  Normocephalic . Face symmetric, atraumatic Ears: TMs normal. Nose: Not congested, sinuses no TTP Throat symmetric, not red.  Poor dentition without evidence of abscess. Some tenderness without click at the right TMJ Lungs:  Few rhonchi, clear with cough Normal respiratory effort, no intercostal retractions, no accessory muscle use. Heart: RRR,  no murmur.  No pretibial edema bilaterally  Skin: Not pale. Not jaundice Neurologic:  alert &  oriented X3.  Speech normal, gait appropriate for age and unassisted Psych--  Cognition and judgment appear intact.  Cooperative with normal attention span and concentration.  Behavior appropriate. No anxious or depressed appearing.      Assessment    ASSESSMENT ADD with hyperactivity (see note from 10-2015) H/o Skull fracture, age ~ 23 y/o H/o lead poisoning    Plan: Sinusitis, bronchitis Symptoms as described above, had a cold 2 weeks ago and now he feels better except for the headache located at the right temple and jaw. Rhonchi noted on exam, O2 sat 94 when she arrived, 97 on recheck. Doubt he has active Covid b/c  symptoms started 2 weeks ago, to be sure we will get a chest x-ray & Covid testing. Treat with a Z-Pak, Mucinex DM, Letter to go back to work once tests come back. I will be able to write a letter excusing from work from today  if he needs to stay home due to current illness..    This visit occurred during the SARS-CoV-2 public health emergency.  Safety protocols were in place, including screening questions prior to the visit, additional usage of staff PPE, and extensive cleaning of exam room while observing appropriate contact time as indicated for disinfecting solutions.

## 2019-02-27 NOTE — Patient Instructions (Addendum)
  STOP BY THE FIRST FLOOR:  get the XR   Start taking antibiotic called Zithromax  Mucinex DM as needed for cough  Schedule your Covid test  We will release you to go back to work ASAP  Call if not gradually improving  Text Covid to  [phone]  Call [phone]

## 2019-02-27 NOTE — Progress Notes (Signed)
Pre visit review using our clinic review tool, if applicable. No additional management support is needed unless otherwise documented below in the visit note. 

## 2019-02-28 LAB — NOVEL CORONAVIRUS, NAA: SARS-CoV-2, NAA: NOT DETECTED

## 2019-02-28 NOTE — Assessment & Plan Note (Signed)
Sinusitis, bronchitis Symptoms as described above, had a cold 2 weeks ago and now he feels better except for the headache located at the right temple and jaw. Rhonchi noted on exam, O2 sat 94 when she arrived, 97 on recheck. Doubt he has active Covid b/c  symptoms started 2 weeks ago, to be sure we will get a chest x-ray & Covid testing. Treat with a Z-Pak, Mucinex DM, Letter to go back to work once tests come back. I will be able to write a letter excusing from work from today  if he needs to stay home due to current illness.Marland Kitchen

## 2019-03-06 ENCOUNTER — Encounter: Payer: Self-pay | Admitting: Internal Medicine

## 2019-03-23 ENCOUNTER — Telehealth: Payer: BC Managed Care – PPO

## 2019-03-23 ENCOUNTER — Encounter: Payer: Self-pay | Admitting: Internal Medicine

## 2019-03-23 ENCOUNTER — Telehealth: Payer: BC Managed Care – PPO | Admitting: Internal Medicine

## 2019-03-23 ENCOUNTER — Encounter (HOSPITAL_COMMUNITY): Payer: Self-pay

## 2019-03-24 ENCOUNTER — Ambulatory Visit: Payer: BC Managed Care – PPO | Attending: Internal Medicine

## 2019-03-24 ENCOUNTER — Encounter: Payer: Self-pay | Admitting: Internal Medicine

## 2019-03-24 DIAGNOSIS — Z20822 Contact with and (suspected) exposure to covid-19: Secondary | ICD-10-CM

## 2019-03-25 LAB — NOVEL CORONAVIRUS, NAA: SARS-CoV-2, NAA: NOT DETECTED

## 2019-03-28 ENCOUNTER — Ambulatory Visit: Payer: BC Managed Care – PPO | Admitting: Internal Medicine

## 2019-03-28 ENCOUNTER — Other Ambulatory Visit: Payer: Self-pay

## 2019-03-28 ENCOUNTER — Encounter: Payer: Self-pay | Admitting: Internal Medicine

## 2019-03-28 VITALS — BP 108/69 | HR 75 | Temp 98.6°F | Resp 16 | Ht 70.0 in | Wt 128.5 lb

## 2019-03-28 DIAGNOSIS — F329 Major depressive disorder, single episode, unspecified: Secondary | ICD-10-CM | POA: Diagnosis not present

## 2019-03-28 DIAGNOSIS — F32A Depression, unspecified: Secondary | ICD-10-CM

## 2019-03-28 DIAGNOSIS — F419 Anxiety disorder, unspecified: Secondary | ICD-10-CM

## 2019-03-28 MED ORDER — ESCITALOPRAM OXALATE 10 MG PO TABS: 10.0000 mg | ORAL_TABLET | Freq: Every day | ORAL | 0 refills | Status: AC

## 2019-03-28 MED ORDER — HYDROXYZINE HCL 25 MG PO TABS
25.0000 mg | ORAL_TABLET | Freq: Every evening | ORAL | 0 refills | Status: AC | PRN
Start: 2019-03-28 — End: ?

## 2019-03-28 MED FILL — ESCITALOPRAM 10 MG TABLET: 10 | 30 days supply | Qty: 30 | Fill #0

## 2019-03-28 MED FILL — hydrOXYzine HCL 25 MG TABS: 25 | 30 days supply | Qty: 30 | Fill #0

## 2019-03-28 NOTE — Progress Notes (Signed)
   Subjective:    Patient ID: Seth Hill, male    DOB: 1995/09/30, 24 y.o.   MRN: [id number]  DOS:  03/28/2019 Type of visit - description: Acute visit Reports intense depression and anxiety. Symptoms started mid 2019 and they are gradually getting worse. He feels sad, down. Very anxious, jittery, sometimes has panic attacks. I asked about triggers, he could not point to any specific one. He broke with his girlfriend 03/13/2019 however he was feeling already the way he is describing.   Review of Systems When asked, admits to some suicidal ideas, no plans, does not own a gun, no plans to hurt anybody. He smokes tobacco and occasionally uses marijuana.  Past Medical History:  Diagnosis Date  . Attention deficit disorder with hyperactivity(314.01)   . Depression   . Fracture, skull ([facility])    ~ 4-5 y/o    Past Surgical History:  Procedure Laterality Date  . MANDIBLE SURGERY  2008   jaw Fx       Objective:   Physical Exam BP 108/69 (BP Location: Left Arm, Patient Position: Sitting, Cuff Size: Small)   Pulse 75   Temp 98.6 F (37 C) (Temporal)   Resp 16   Ht 5\' 10"  (1.778 m)   Wt 128 lb 8 oz (58.3 kg)   SpO2 98%   BMI 18.44 kg/m  General:   Well developed, NAD, BMI noted. HEENT:  Normocephalic . Face symmetric, atraumatic Neurologic:  alert & oriented X3.  Speech normal, gait appropriate for age and unassisted Psych--  Cognition and judgment appear intact.  Cooperative with normal attention span and concentration.  Behavior appropriate. He seems slightly anxious but not depressed.     Assessment     ASSESSMENT ADD with hyperactivity (see note from 10-2015) Depression, anxiety H/o Skull fracture, age ~ 24 y/o H/o lead poisoning    Plan: Depression, anxiety: Similar symptoms back in 2017, after that he felt well until  mid 2019. Back in 2017, he tried Lexapro, did not increase his suicidal ideas, he felt somewhat better but did not pursue  treatment. At this point he is very depressed, and anxious, not sleeping well.  + Suicidal thoughts.  PHQ-9 25 (very high) Plan: Recommend to see a counselor Ideally I would refer him to see a psychiatrist but that may take many weeks so I elected to prescribe Lexapro 10 mg.  Extensively warned about increase in suicidal ideas, his family knows he is coming today he plans to share with them the potential s/e of Lexapro. See AVS. Atarax as needed for sleep as Reassess in 3 weeks   32 min This visit occurred during the SARS-CoV-2 public health emergency.  Safety protocols were in place, including screening questions prior to the visit, additional usage of staff PPE, and extensive cleaning of exam room while observing appropriate contact time as indicated for disinfecting solutions.

## 2019-03-28 NOTE — Patient Instructions (Addendum)
  GO TO THE FRONT DESK Schedule your next appointment   for a checkup in 3 weeks   Start Lexapro 10 mg 1 tablet daily One of the side effects includes include increase in suicidal ideas, if that happen communicate with your family immediately or call me or the 24-hour helpline.  See a counselor  Hydroxyzine 25 mg 1 tablet as needed if you cannot sleep   SUICIDE PREVENTION:  Find Help 24/7, [place] Call our 24-hour HelpLine at 773-196-9468 or 585-158-1826    National Hopeline Network: 1-800-SUICIDE   The National Suicide Prevention Lifeline: [employer]

## 2019-03-28 NOTE — Progress Notes (Signed)
Pre visit review using our clinic review tool, if applicable. No additional management support is needed unless otherwise documented below in the visit note. 

## 2019-03-30 DIAGNOSIS — F32A Depression, unspecified: Secondary | ICD-10-CM | POA: Insufficient documentation

## 2019-03-30 DIAGNOSIS — F419 Anxiety disorder, unspecified: Secondary | ICD-10-CM | POA: Insufficient documentation

## 2019-03-30 DIAGNOSIS — F329 Major depressive disorder, single episode, unspecified: Secondary | ICD-10-CM | POA: Insufficient documentation

## 2019-03-30 NOTE — Assessment & Plan Note (Signed)
Depression, anxiety: Similar symptoms back in 2017, after that he felt well until  mid 2019. Back in 2017, he tried Lexapro, did not increase his suicidal ideas, he felt somewhat better but did not pursue treatment. At this point he is very depressed, and anxious, not sleeping well.  + Suicidal thoughts.  PHQ-9 25 (very high) Plan: Recommend to see a counselor Ideally I would refer him to see a psychiatrist but that may take many weeks so I elected to prescribe Lexapro 10 mg.  Extensively warned about increase in suicidal ideas, his family knows he is coming today he plans to share with them the potential s/e of Lexapro. See AVS. Atarax as needed for sleep as Reassess in 3 weeks

## 2019-04-12 ENCOUNTER — Telehealth: Payer: BC Managed Care – PPO | Admitting: Nurse Practitioner

## 2019-04-12 ENCOUNTER — Encounter: Payer: Self-pay | Admitting: Internal Medicine

## 2019-04-12 DIAGNOSIS — R197 Diarrhea, unspecified: Secondary | ICD-10-CM

## 2019-04-12 MED ORDER — AZITHROMYCIN 500 MG PO TABS: 500.0000 mg | ORAL_TABLET | Freq: Every day | ORAL | 0 refills | Status: DC

## 2019-04-12 MED FILL — AZITHROMYCIN 500 MG TABLET: 500 | 3 days supply | Qty: 3 | Fill #0

## 2019-04-12 NOTE — Progress Notes (Signed)
We are sorry that you are not feeling well.  Here is how we plan to help!  Based on what you have shared with me it looks like you have Acute Infectious Diarrhea.  Most cases of acute diarrhea are due to infections with virus and bacteria and are self-limited conditions lasting less than 14 days.  For your symptoms you may take Imodium 2 mg tablets that are over the counter at your local pharmacy. Take two tablet now and then one after each loose stool up to 6 a day.  Antibiotics are not needed for most people with diarrhea.   Optional: I have prescribed azithromycin 500 mg daily for 3 days  HOME CARE  We recommend changing your diet to help with your symptoms for the next few days.  Drink plenty of fluids that contain water salt and sugar. Sports drinks such as Gatorade may help.   You may try broths, soups, bananas, applesauce, soft breads, mashed potatoes or crackers.   You are considered infectious for as long as the diarrhea continues. Hand washing or use of alcohol based hand sanitizers is recommend.  It is best to stay out of work or school until your symptoms stop.   GET HELP RIGHT AWAY  If you have dark yellow colored urine or do not pass urine frequently you should drink more fluids.    If your symptoms worsen   If you feel like you are going to pass out (faint)  You have a new problem  MAKE SURE YOU   Understand these instructions.  Will watch your condition.  Will get help right away if you are not doing well or get worse.  Your e-visit answers were reviewed by a board certified advanced clinical practitioner to complete your personal care plan.  Depending on the condition, your plan could have included both over the counter or prescription medications.  If there is a problem please reply  once you have received a response from your provider.  Your safety is important to [place].  If you have drug allergies check your prescription carefully.    You can use  MyChart to ask questions about today's visit, request a non-urgent call back, or ask for a work or school excuse for 24 hours related to this e-Visit. If it has been greater than 24 hours you will need to follow up with your provider, or enter a new e-Visit to address those concerns.   You will get an e-mail in the next two days asking about your experience.  I hope that your e-visit has been valuable and will speed your recovery. Thank you for using e-visits.  5-10 minutes spent reviewing and documenting in chart.

## 2019-05-02 MED FILL — hydrOXYzine HCL 25 MG TABS: 25 | 30 days supply | Qty: 30 | Fill #0

## 2019-05-02 MED FILL — ESCITALOPRAM 10 MG TABLET: 10 | 30 days supply | Qty: 30 | Fill #0

## 2019-09-13 ENCOUNTER — Telehealth (INDEPENDENT_AMBULATORY_CARE_PROVIDER_SITE_OTHER): Payer: Self-pay | Admitting: Family

## 2019-09-13 DIAGNOSIS — A084 Viral intestinal infection, unspecified: Secondary | ICD-10-CM

## 2019-09-13 MED ORDER — ONDANSETRON 4 MG PO TBDP: 4.0000 mg | ORAL_TABLET | Freq: Three times a day (TID) | ORAL | 0 refills | Status: AC | PRN

## 2019-09-13 MED FILL — ONDANSETRON ODT 4MG TBDP: 4 | 7 days supply | Qty: 20 | Fill #0

## 2019-09-13 NOTE — Progress Notes (Signed)
Virtual Visit via Video Note  I connected with Seth Hill on 09/13/19 at  3:40 PM EDT by a video enabled telemedicine application and verified that I am speaking with the correct person using two identifiers.  Location: Patient: home Provider: work   I discussed the limitations of evaluation and management by telemedicine and the availability of in person appointments. The patient expressed understanding and agreed to proceed. Only the patient and myself were present for today's video call.   History of Present Illness:  Patient reports that he started developing cramps/nausea/diarrhea after eating pizza yesterday. Still mild nausea, diarrhea is improving.  Brother ate the same pizza and dit not get.  2-3 loose stools today.  No vomiting but has been dry heaving. He has not eating but tolerating fluids.  He has not tried any otc meds such as imodium.  Observations/Objective:   Gen: Awake, alert, no acute distress Resp: Breathing is even and non-labored Psych: calm/pleasant demeanor Neuro: Alert and Oriented x 3, + facial symmetry, speech is clear.   Assessment and Plan:  Viral gastroenteritis- patient is advised to continue aggressive hydration. Advance diet as tolerated starting with BRAT diet.  He is advised that he may use imodium as needed.  Also sent an rx for prn zofran for nausea. Recommended that he call if new/worsening symptoms or if symptoms fail to improve. I wrote him out of work today and tomorrow.  This visit occurred during the SARS-CoV-2 public health emergency.  Safety protocols were in place, including screening questions prior to the visit, additional usage of staff PPE, and extensive cleaning of exam room while observing appropriate contact time as indicated for disinfecting solutions.     Follow Up Instructions:    I discussed the assessment and treatment plan with the patient. The patient was provided an opportunity to ask questions and all were  answered. The patient agreed with the plan and demonstrated an understanding of the instructions.   The patient was advised to call back or seek an in-person evaluation if the symptoms worsen or if the condition fails to improve as anticipated.  Lemont Fillers, NP

## 2019-11-29 ENCOUNTER — Encounter (HOSPITAL_BASED_OUTPATIENT_CLINIC_OR_DEPARTMENT_OTHER): Payer: Self-pay

## 2019-11-29 ENCOUNTER — Other Ambulatory Visit: Payer: Self-pay

## 2019-11-29 ENCOUNTER — Emergency Department (HOSPITAL_BASED_OUTPATIENT_CLINIC_OR_DEPARTMENT_OTHER)
Admission: EM | Admit: 2019-11-29 | Discharge: 2019-11-29 | Disposition: A | Discharge status: Home / Self Care | Payer: Self-pay | Attending: Emergency Medicine | Admitting: Emergency Medicine

## 2019-11-29 DIAGNOSIS — M545 Low back pain, unspecified: Secondary | ICD-10-CM

## 2019-11-29 DIAGNOSIS — X509XXA Other and unspecified overexertion or strenuous movements or postures, initial encounter: Secondary | ICD-10-CM | POA: Insufficient documentation

## 2019-11-29 DIAGNOSIS — F17210 Nicotine dependence, cigarettes, uncomplicated: Secondary | ICD-10-CM | POA: Insufficient documentation

## 2019-11-29 NOTE — ED Triage Notes (Addendum)
Pt states he had back pain 9/20-denies injury-denies pain at this time-states he called out of work and needs RTW note-NAD-steady gait

## 2019-11-29 NOTE — ED Provider Notes (Signed)
[facility] EMERGENCY DEPARTMENT Provider Note   CSN: [id number] Arrival date & time: 11/29/19  1318     History Chief Complaint  Patient presents with  . Letter for School/Work    Seth Hill is a 24 y.o. male.  24 yo male presents with low back pain. Patient states he was lifting something 2 days ago and his back tightened up on him. Patient was told he would need a note to be able to return to work today without restrictions. Denies any traumatic injuries or residual pain. No other complaints or concenrs.         Past Medical History:  Diagnosis Date  . Attention deficit disorder with hyperactivity(314.01)   . Depression   . Fracture, skull ([facility])    ~ 4-5 y/o    Patient Active Problem List   Diagnosis Date Noted  . Anxiety and depression 03/30/2019  . PCP NOTES >>>>>>>>>>>>>> 09/18/2015  . Annual physical exam 12/13/2012  . ADHD (attention deficit hyperactivity disorder) 12/13/2012    Past Surgical History:  Procedure Laterality Date  . MANDIBLE SURGERY  2008   jaw Fx        Family History  Problem Relation Age of Onset  . Macular degeneration Father   . Diabetes Father   . Hyperlipidemia Father   . Brain cancer Paternal Grandfather        brain tumor  . CAD Neg Hx     Social History   Tobacco Use  . Smoking status: Current Every Day Smoker    Types: Cigarettes  . Smokeless tobacco: Never Used  Vaping Use  . Vaping Use: Never used  Substance Use Topics  . Alcohol use: No    Alcohol/week: 0.0 standard drinks  . Drug use: Yes    Types: Marijuana    Home Medications Prior to Admission medications   Medication Sig Start Date End Date Taking? Authorizing Provider  escitalopram (LEXAPRO) 10 MG tablet Take 1 tablet (10 mg total) by mouth daily. Patient not taking: Reported on 09/13/2019 03/28/19   Wanda Plump, MD  hydrOXYzine (ATARAX/VISTARIL) 25 MG tablet Take 1 tablet (25 mg total) by mouth at bedtime as needed. Patient not  taking: Reported on 09/13/2019 03/28/19   Wanda Plump, MD  ondansetron (ZOFRAN ODT) 4 MG disintegrating tablet Take 1 tablet (4 mg total) by mouth every 8 (eight) hours as needed for nausea or vomiting. 09/13/19   Sandford Craze, NP    Allergies    Patient has no known allergies.  Review of Systems   Review of Systems  Constitutional: Negative for fever.  Gastrointestinal: Negative for abdominal pain.  Musculoskeletal: Positive for back pain.  Skin: Negative for rash and wound.  Allergic/Immunologic: Negative for immunocompromised state.  Neurological: Negative for weakness and numbness.  All other systems reviewed and are negative.   Physical Exam Updated Vital Signs BP 111/79 (BP Location: Left Arm)   Pulse 85   Temp (!) 97.3 F (36.3 C) (Oral)   Resp 16   Ht 6' (1.829 m)   Wt 58.1 kg   SpO2 98%   BMI 17.36 kg/m   Physical Exam Vitals and nursing note reviewed.  Constitutional:      General: He is not in acute distress.    Appearance: He is well-developed. He is not diaphoretic.  HENT:     Head: Normocephalic and atraumatic.  Pulmonary:     Effort: Pulmonary effort is normal.  Abdominal:     Palpations:  Abdomen is soft.     Tenderness: There is no abdominal tenderness.  Musculoskeletal:        General: Tenderness present. No deformity. Normal range of motion.     Cervical back: Normal. No tenderness or bony tenderness.     Thoracic back: Normal. No tenderness or bony tenderness.     Lumbar back: Spasms and tenderness present. No bony tenderness. Normal range of motion.       Back:  Skin:    General: Skin is warm and dry.     Findings: No erythema or rash.  Neurological:     Mental Status: He is alert and oriented to person, place, and time.     Motor: No weakness.     Gait: Gait normal.  Psychiatric:        Behavior: Behavior normal.     ED Results / Procedures / Treatments   Labs (all labs ordered are listed, but only abnormal results are  displayed) Labs Reviewed - No data to display  EKG None  Radiology No results found.  Procedures Procedures (including critical care time)  Medications Ordered in ED Medications - No data to display  ED Course  I have reviewed the triage vital signs and the nursing notes.  Pertinent labs & imaging results that were available during my care of the patient were reviewed by me and considered in my medical decision making (see chart for details).  Clinical Course as of Nov 29 1507  Wed Nov 29, 2019  1508 23yo male with low back pain with request for note to return to work. On exam, found to have mild right and left lower back pain with mild spasm, normal ROM, normal gait. Patient cleared for work as requested.    [LM]    Clinical Course User Index [LM] Alden Hipp   MDM Rules/Calculators/A&P                           Final Clinical Impression(s) / ED Diagnoses Final diagnoses:  Acute bilateral low back pain without sciatica    Rx / DC Orders ED Discharge Orders    None       Jeannie Fend, PA-C 11/29/19 1509    Alvira Monday, MD 11/30/19 1053

## 2019-11-29 NOTE — ED Notes (Signed)
Had back pain on the 20th  Did not go to work  Now needs work note  Denies pain at present

## 2020-05-13 ENCOUNTER — Other Ambulatory Visit (HOSPITAL_COMMUNITY): Payer: Self-pay | Admitting: Emergency Medicine

## 2020-05-13 ENCOUNTER — Encounter (HOSPITAL_BASED_OUTPATIENT_CLINIC_OR_DEPARTMENT_OTHER): Payer: Self-pay | Admitting: *Deleted

## 2020-05-13 ENCOUNTER — Other Ambulatory Visit: Payer: Self-pay

## 2020-05-13 ENCOUNTER — Emergency Department (HOSPITAL_BASED_OUTPATIENT_CLINIC_OR_DEPARTMENT_OTHER)
Admission: EM | Admit: 2020-05-13 | Discharge: 2020-05-13 | Disposition: A | Discharge status: Home / Self Care | Payer: Self-pay | Attending: Emergency Medicine | Admitting: Emergency Medicine

## 2020-05-13 DIAGNOSIS — L03211 Cellulitis of face: Secondary | ICD-10-CM | POA: Insufficient documentation

## 2020-05-13 DIAGNOSIS — F17210 Nicotine dependence, cigarettes, uncomplicated: Secondary | ICD-10-CM | POA: Insufficient documentation

## 2020-05-13 MED ORDER — DOXYCYCLINE HYCLATE 100 MG PO CAPS: 100.0000 mg | ORAL_CAPSULE | Freq: Two times a day (BID) | ORAL | 0 refills | Status: DC

## 2020-05-13 MED FILL — DOXYCYCLINE HYCLATE 100 MG: 100 | 7 days supply | Qty: 14 | Fill #0

## 2020-05-13 NOTE — ED Provider Notes (Signed)
[facility] EMERGENCY DEPARTMENT Provider Note   CSN: [id number] Arrival date & time: 05/13/20  1435     History Chief Complaint  Patient presents with  . Facial Swelling    Seth Hill is a 25 y.o. male.  The history is provided by the patient.   Seth Hill is a 25 y.o. male who presents to the Emergency Department complaining of facial swelling. Two days ago he had a pimple just beneath his nose that he attempted to pop home. Yesterday he began to feel increased swelling and discomfort just under his nose and today he noticed significant swelling to his upper lip. She denies any fevers, difficulty swallowing, difficulty breathing or additional symptoms. He has no medical problems and takes no medications. Symptoms are moderate and worsening.    Past Medical History:  Diagnosis Date  . Attention deficit disorder with hyperactivity(314.01)   . Depression   . Fracture, skull (HCC)    ~ 4-5 y/o    Patient Active Problem List   Diagnosis Date Noted  . Anxiety and depression 03/30/2019  . PCP NOTES >>>>>>>>>>>>>> 09/18/2015  . Annual physical exam 12/13/2012  . ADHD (attention deficit hyperactivity disorder) 12/13/2012    Past Surgical History:  Procedure Laterality Date  . MANDIBLE SURGERY  2008   jaw Fx        Family History  Problem Relation Age of Onset  . Macular degeneration Father   . Diabetes Father   . Hyperlipidemia Father   . Brain cancer Paternal Grandfather        brain tumor  . CAD Neg Hx     Social History   Tobacco Use  . Smoking status: Current Every Day Smoker    Types: Cigarettes  . Smokeless tobacco: Never Used  Vaping Use  . Vaping Use: Never used  Substance Use Topics  . Alcohol use: No    Alcohol/week: 0.0 standard drinks  . Drug use: Yes    Types: Marijuana    Home Medications Prior to Admission medications   Medication Sig Start Date End Date Taking? Authorizing Provider  doxycycline (VIBRAMYCIN)  100 MG capsule Take 1 capsule (100 mg total) by mouth 2 (two) times daily. 05/13/20  Yes Tilden Fossa, MD  escitalopram (LEXAPRO) 10 MG tablet Take 1 tablet (10 mg total) by mouth daily. Patient not taking: No sig reported 03/28/19   Wanda Plump, MD  hydrOXYzine (ATARAX/VISTARIL) 25 MG tablet Take 1 tablet (25 mg total) by mouth at bedtime as needed. Patient not taking: No sig reported 03/28/19   Wanda Plump, MD  ondansetron (ZOFRAN ODT) 4 MG disintegrating tablet Take 1 tablet (4 mg total) by mouth every 8 (eight) hours as needed for nausea or vomiting. 09/13/19   Sandford Craze, NP    Allergies    Patient has no known allergies.  Review of Systems   Review of Systems  All other systems reviewed and are negative.   Physical Exam Updated Vital Signs BP 125/80 (BP Location: Right Arm)   Pulse 81   Temp 98.5 F (36.9 C) (Oral)   Resp 20   Ht 6' (1.829 m)   Wt 58.1 kg   SpO2 98%   BMI 17.37 kg/m   Physical Exam Vitals and nursing note reviewed.  Constitutional:      Appearance: Normal appearance.  HENT:     Head: Normocephalic and atraumatic.     Comments: There is a 1 cm area of erythema, induration and  firmness just inferior to the nose. There is no significant edema in the nares. There is moderate edema without erythema to the entire upper lip. There is no erythema or edema in the posterior oropharynx. Eyes:     Extraocular Movements: Extraocular movements intact.     Pupils: Pupils are equal, round, and reactive to light.  Cardiovascular:     Rate and Rhythm: Normal rate and regular rhythm.  Pulmonary:     Effort: Pulmonary effort is normal. No respiratory distress.  Musculoskeletal:     Cervical back: Normal range of motion. Tenderness present.  Skin:    General: Skin is warm and dry.  Neurological:     Mental Status: He is alert.     Comments: No asymmetry of facial movements  Psychiatric:        Mood and Affect: Mood normal.        Behavior: Behavior normal.      ED Results / Procedures / Treatments   Labs (all labs ordered are listed, but only abnormal results are displayed) Labs Reviewed - No data to display  EKG None  Radiology No results found.  Procedures Procedures   Medications Ordered in ED Medications - No data to display  ED Course  I have reviewed the triage vital signs and the nursing notes.  Pertinent labs & imaging results that were available during my care of the patient were reviewed by me and considered in my medical decision making (see chart for details).    MDM Rules/Calculators/A&P                         patient here for evaluation of facial pain and swelling, he does have a focal area of induration and tenderness that is approximately 1 cm in size. Bedside ultrasound does not demonstrate a drainable fluid collection.  He has surrounding 5+ cm of reactive edema.  Will treat with abx for cellulitis.  Discussed home care, outpatient follow up and return precautions.    Final Clinical Impression(s) / ED Diagnoses Final diagnoses:  Facial cellulitis    Rx / DC Orders ED Discharge Orders         Ordered    doxycycline (VIBRAMYCIN) 100 MG capsule  2 times daily        05/13/20 1549           Tilden Fossa, MD 05/13/20 1554

## 2020-05-13 NOTE — ED Triage Notes (Signed)
Woke with swelling to the left side of his face. He has a small red pimple above his upper lip.

## 2020-05-17 ENCOUNTER — Encounter (HOSPITAL_BASED_OUTPATIENT_CLINIC_OR_DEPARTMENT_OTHER): Payer: Self-pay | Admitting: Emergency Medicine

## 2020-05-17 ENCOUNTER — Emergency Department (HOSPITAL_BASED_OUTPATIENT_CLINIC_OR_DEPARTMENT_OTHER)
Admission: EM | Admit: 2020-05-17 | Discharge: 2020-05-18 | Disposition: A | Discharge status: Home / Self Care | Payer: Self-pay | Attending: Emergency Medicine | Admitting: Emergency Medicine

## 2020-05-17 ENCOUNTER — Other Ambulatory Visit: Payer: Self-pay

## 2020-05-17 DIAGNOSIS — X58XXXD Exposure to other specified factors, subsequent encounter: Secondary | ICD-10-CM | POA: Insufficient documentation

## 2020-05-17 DIAGNOSIS — F17200 Nicotine dependence, unspecified, uncomplicated: Secondary | ICD-10-CM | POA: Insufficient documentation

## 2020-05-17 DIAGNOSIS — S0180XD Unspecified open wound of other part of head, subsequent encounter: Secondary | ICD-10-CM | POA: Insufficient documentation

## 2020-05-17 DIAGNOSIS — Z5189 Encounter for other specified aftercare: Secondary | ICD-10-CM

## 2020-05-17 MED ORDER — MUPIROCIN CALCIUM 2 % EX CREA: 1.0000 "application " | TOPICAL_CREAM | Freq: Two times a day (BID) | CUTANEOUS | 0 refills | Status: AC

## 2020-05-17 NOTE — ED Triage Notes (Signed)
Pt states he was here on Monday for an abscess on his lip  Pt states he was discharged on antibiotics and was told to return if sxs got worse  Pt states now the scab has come off and it has been draining pus

## 2020-05-17 NOTE — Discharge Instructions (Addendum)
Use warm compress on your face as discussed.  Use topical antibiotic as prescribed.  Continue oral antibiotics.  Follow-up with ENT if needed.

## 2020-05-17 NOTE — ED Provider Notes (Signed)
[facility] EMERGENCY DEPARTMENT Provider Note   CSN: [id number] Arrival date & time: 05/17/20  2003     History Chief Complaint  Patient presents with  . Wound Check    AUTHER LYERLY is a 25 y.o. male.  The history is provided by the patient.  Illness Location:  Face Severity:  Mild Onset quality:  Gradual Timing:  Constant Progression:  Improving Chronicity:  New Context:  Patient here for wound recheck.  Has had great improvement with antibiotics.  Having some drainage now.  Has cellulitis/small abscess to face Relieved by:  Antibiotics Worsened by:  Nothing Associated symptoms: no congestion, no cough, no fever, no headaches, no rash and no sore throat        Past Medical History:  Diagnosis Date  . Attention deficit disorder with hyperactivity(314.01)   . Depression   . Fracture, skull (HCC)    ~ 4-5 y/o    Patient Active Problem List   Diagnosis Date Noted  . Anxiety and depression 03/30/2019  . PCP NOTES >>>>>>>>>>>>>> 09/18/2015  . Annual physical exam 12/13/2012  . ADHD (attention deficit hyperactivity disorder) 12/13/2012    Past Surgical History:  Procedure Laterality Date  . MANDIBLE SURGERY  2008   jaw Fx        Family History  Problem Relation Age of Onset  . Macular degeneration Father   . Diabetes Father   . Hyperlipidemia Father   . Brain cancer Paternal Grandfather        brain tumor  . CAD Neg Hx     Social History   Tobacco Use  . Smoking status: Current Every Day Smoker    Types: Cigarettes  . Smokeless tobacco: Never Used  Vaping Use  . Vaping Use: Never used  Substance Use Topics  . Alcohol use: No    Alcohol/week: 0.0 standard drinks  . Drug use: Yes    Types: Marijuana    Home Medications Prior to Admission medications   Medication Sig Start Date End Date Taking? Authorizing Provider  mupirocin cream (BACTROBAN) 2 % Apply 1 application topically 2 (two) times daily for 7 days. 05/17/20 05/24/20  Yes Curatolo, Adam, DO  doxycycline (VIBRAMYCIN) 100 MG capsule Take 1 capsule (100 mg total) by mouth 2 (two) times daily. 05/13/20   Tilden Fossa, MD  escitalopram (LEXAPRO) 10 MG tablet Take 1 tablet (10 mg total) by mouth daily. Patient not taking: No sig reported 03/28/19   Wanda Plump, MD  hydrOXYzine (ATARAX/VISTARIL) 25 MG tablet Take 1 tablet (25 mg total) by mouth at bedtime as needed. Patient not taking: No sig reported 03/28/19   Wanda Plump, MD  ondansetron (ZOFRAN ODT) 4 MG disintegrating tablet Take 1 tablet (4 mg total) by mouth every 8 (eight) hours as needed for nausea or vomiting. 09/13/19   Sandford Craze, NP    Allergies    Patient has no known allergies.  Review of Systems   Review of Systems  Constitutional: Negative for chills and fever.  HENT: Positive for dental problem and facial swelling. Negative for congestion, drooling, ear discharge, sinus pressure, sinus pain, sore throat, trouble swallowing and voice change.   Respiratory: Negative for cough.   Skin: Positive for color change and wound. Negative for pallor and rash.  Neurological: Negative for dizziness, facial asymmetry and headaches.    Physical Exam Updated Vital Signs  ED Triage Vitals  Enc Vitals Group     BP 05/17/20 2022 115/86  Pulse Rate 05/17/20 2022 77     Resp 05/17/20 2022 16     Temp 05/17/20 2022 98.5 F (36.9 C)     Temp Source 05/17/20 2022 Oral     SpO2 05/17/20 2022 100 %     Weight 05/17/20 2020 125 lb 3.2 oz (56.8 kg)     Height 05/17/20 2020 6' (1.829 m)     Head Circumference --      Peak Flow --      Pain Score 05/17/20 2019 5     Pain Loc --      Pain Edu? --      Excl. in GC? --     Physical Exam Constitutional:      General: He is not in acute distress.    Appearance: He is not ill-appearing.  HENT:     Head: Normocephalic.     Nose: Nose normal. No congestion.     Comments: There is swelling to the philtrum of the lips just below the nose with some  mild fluctuance with drainage of purulent material, no obvious abscess of the gingiva or intranasal space    Mouth/Throat:     Mouth: Mucous membranes are moist.     Comments: Poor dentition Eyes:     Extraocular Movements: Extraocular movements intact.     Pupils: Pupils are equal, round, and reactive to light.  Cardiovascular:     Pulses: Normal pulses.     Heart sounds: Normal heart sounds.  Neurological:     Mental Status: He is alert.     ED Results / Procedures / Treatments   Labs (all labs ordered are listed, but only abnormal results are displayed) Labs Reviewed - No data to display  EKG None  Radiology No results found.  Procedures Procedures   Medications Ordered in ED Medications - No data to display  ED Course  I have reviewed the triage vital signs and the nursing notes.  Pertinent labs & imaging results that were available during my care of the patient were reviewed by me and considered in my medical decision making (see chart for details).    MDM Rules/Calculators/A&P                          ERION WEIGHTMAN is a 25 year old male here with wound recheck.  Diagnosed with facial cellulitis.  On doxycycline.  Has not been using warm compresses.  However wound has greatly improved.  He states that swelling now is very focal and he started to notice purulent drainage.  He has active drainage on exam.  I was able to express more purulent fluid from the philtrum of his lip.  There did not appear to be any deep space abscess otherwise.  Did not involve his teeth or gingiva.  Did not involve the nasal area.  Recommend that he use warm compresses which she has not been using.  Will also add Bactroban.  Wound care instructions given.  Understands return precautions.  Given information to follow-up with ENT if needed.  Discharged in good condition.  This chart was dictated using voice recognition software.  Despite best efforts to proofread,  errors can occur which  can change the documentation meaning.    Final Clinical Impression(s) / ED Diagnoses Final diagnoses:  Visit for wound check    Rx / DC Orders ED Discharge Orders         Ordered    mupirocin cream (  BACTROBAN) 2 %  2 times daily        05/17/20 2329           Virgina Norfolk, DO 05/17/20 2333

## 2020-05-20 ENCOUNTER — Encounter: Payer: Self-pay | Admitting: Internal Medicine

## 2020-10-16 ENCOUNTER — Emergency Department (HOSPITAL_BASED_OUTPATIENT_CLINIC_OR_DEPARTMENT_OTHER)
Admission: EM | Admit: 2020-10-16 | Discharge: 2020-10-16 | Disposition: A | Discharge status: Home / Self Care | Payer: Self-pay | Attending: Emergency Medicine | Admitting: Emergency Medicine

## 2020-10-16 ENCOUNTER — Other Ambulatory Visit: Payer: Self-pay

## 2020-10-16 ENCOUNTER — Emergency Department (HOSPITAL_BASED_OUTPATIENT_CLINIC_OR_DEPARTMENT_OTHER): Payer: Self-pay

## 2020-10-16 ENCOUNTER — Encounter (HOSPITAL_BASED_OUTPATIENT_CLINIC_OR_DEPARTMENT_OTHER): Payer: Self-pay

## 2020-10-16 DIAGNOSIS — F17210 Nicotine dependence, cigarettes, uncomplicated: Secondary | ICD-10-CM | POA: Insufficient documentation

## 2020-10-16 DIAGNOSIS — L0291 Cutaneous abscess, unspecified: Secondary | ICD-10-CM

## 2020-10-16 DIAGNOSIS — L02415 Cutaneous abscess of right lower limb: Secondary | ICD-10-CM | POA: Insufficient documentation

## 2020-10-16 MED ORDER — IBUPROFEN 400 MG PO TABS
600.0000 mg | ORAL_TABLET | Freq: Once | ORAL | Status: AC
  Administered 2020-10-16: 600 mg via ORAL
  Filled 2020-10-16: qty 1

## 2020-10-16 MED ORDER — SULFAMETHOXAZOLE-TRIMETHOPRIM 800-160 MG PO TABS: 1.0000 | ORAL_TABLET | Freq: Two times a day (BID) | ORAL | 0 refills | Status: AC

## 2020-10-16 MED ORDER — LIDOCAINE HCL (PF) 1 % IJ SOLN
5.0000 mL | Freq: Once | INTRAMUSCULAR | Status: AC
  Administered 2020-10-16: 5 mL
  Filled 2020-10-16: qty 5

## 2020-10-16 MED ORDER — ACETAMINOPHEN 325 MG PO TABS
650.0000 mg | ORAL_TABLET | Freq: Once | ORAL | Status: AC
  Administered 2020-10-16: 650 mg via ORAL
  Filled 2020-10-16: qty 2

## 2020-10-16 NOTE — ED Triage Notes (Signed)
Pt c/o painful red area below right knee x 1 week-denies injury or known break in skin-NAD-steady gait

## 2020-10-16 NOTE — ED Notes (Signed)
EDP reported pt had a syncopal episode during the procedure. Pt currently pale and diaphoretic, positioned supine at this time. Will continue to monitor.

## 2020-10-16 NOTE — ED Provider Notes (Signed)
[facility] EMERGENCY DEPARTMENT Provider Note   CSN: [id number] Arrival date & time: 10/16/20  1941     History Chief Complaint  Patient presents with   Leg Pain    Seth Hill is a 25 y.o. male.  Patient presents with right knee swelling and pain ongoing for a week.  Does not know what happened but noticed swelling redness and pain to the right knee.  Worsening over the last week.  No reports of fevers or chills no vomiting or diarrhea.  No prior history of similar skin infections.  Denies any pain with ambulation denies any pain with bending his knee.      Past Medical History:  Diagnosis Date   Attention deficit disorder with hyperactivity(314.01)    Depression    Fracture, skull (HCC)    ~ 4-5 y/o    Patient Active Problem List   Diagnosis Date Noted   Anxiety and depression 03/30/2019   PCP NOTES >>>>>>>>>>>>>> 09/18/2015   Annual physical exam 12/13/2012   ADHD (attention deficit hyperactivity disorder) 12/13/2012    Past Surgical History:  Procedure Laterality Date   MANDIBLE SURGERY  2008   jaw Fx        Family History  Problem Relation Age of Onset   Macular degeneration Father    Diabetes Father    Hyperlipidemia Father    Brain cancer Paternal Grandfather        brain tumor   CAD Neg Hx     Social History   Tobacco Use   Smoking status: Every Day    Types: Cigarettes   Smokeless tobacco: Never  Vaping Use   Vaping Use: Never used  Substance Use Topics   Alcohol use: No    Alcohol/week: 0.0 standard drinks   Drug use: Yes    Types: Marijuana    Home Medications Prior to Admission medications   Medication Sig Start Date End Date Taking? Authorizing Provider  sulfamethoxazole-trimethoprim (BACTRIM DS) 800-160 MG tablet Take 1 tablet by mouth 2 (two) times daily for 7 days. 10/16/20 10/23/20 Yes Cheryll Cockayne, MD  escitalopram (LEXAPRO) 10 MG tablet Take 1 tablet (10 mg total) by mouth daily. Patient not taking: No  sig reported 03/28/19   Wanda Plump, MD  hydrOXYzine (ATARAX/VISTARIL) 25 MG tablet Take 1 tablet (25 mg total) by mouth at bedtime as needed. Patient not taking: No sig reported 03/28/19   Wanda Plump, MD  ondansetron (ZOFRAN ODT) 4 MG disintegrating tablet Take 1 tablet (4 mg total) by mouth every 8 (eight) hours as needed for nausea or vomiting. 09/13/19   Sandford Craze, NP    Allergies    Patient has no known allergies.  Review of Systems   Review of Systems  Constitutional:  Negative for fever.  HENT:  Negative for ear pain and sore throat.   Eyes:  Negative for pain.  Respiratory:  Negative for cough.   Cardiovascular:  Negative for chest pain.  Gastrointestinal:  Negative for abdominal pain.  Genitourinary:  Negative for flank pain.  Musculoskeletal:  Negative for back pain.  Skin:  Positive for wound. Negative for color change and rash.  Neurological:  Negative for syncope.  All other systems reviewed and are negative.  Physical Exam Updated Vital Signs BP 97/65   Pulse (!) 50   Temp 98.5 F (36.9 C) (Oral)   Resp 20   Ht 5\' 11"  (1.803 m)   Wt 59.4 kg   SpO2 99%  BMI 18.27 kg/m   Physical Exam Constitutional:      Appearance: He is well-developed.  HENT:     Head: Normocephalic.     Nose: Nose normal.  Eyes:     Extraocular Movements: Extraocular movements intact.  Cardiovascular:     Rate and Rhythm: Normal rate.  Pulmonary:     Effort: Pulmonary effort is normal.  Skin:    Coloration: Skin is not jaundiced.     Comments: Right knee inferior to the patella patient has a 3 x 3 cm region of erythema fluctuance in the center.  Tender to palpation here.  Otherwise neurovascularly intact bilateral lower extremities.  Neurological:     Mental Status: He is alert. Mental status is at baseline.    ED Results / Procedures / Treatments   Labs (all labs ordered are listed, but only abnormal results are displayed) Labs Reviewed - No data to  display  EKG None  Radiology DG Knee Complete 4 Views Right  Result Date: 10/16/2020 CLINICAL DATA:  Knee pain swollen EXAM: RIGHT KNEE - COMPLETE 4+ VIEW COMPARISON:  None. FINDINGS: No fracture or malalignment. Joint spaces are patent. Anterior knee swelling. Trace effusion IMPRESSION: Soft tissue swelling without acute osseous abnormality Electronically Signed   By: Jasmine Pang M.D.   On: 10/16/2020 20:50    Procedures .Marland KitchenIncision and Drainage  Date/Time: 10/16/2020 9:55 PM Performed by: Cheryll Cockayne, MD Authorized by: Cheryll Cockayne, MD   Comments:     Wound prepped with Betadine x3.  Lidocaine 1% epinephrine instilled total of 1 cc in a linear fashion on the skin.  11 blade used to make a stab incision.  Total of 1.5 cc of purulent material expressed.  Wound packed with quarter inch iodoform gauze packing.  Patient syncopized near the end of the procedure and was unresponsive for about 3 to 4 seconds before regaining consciousness.  He was sitting down and did not fall.   Medications Ordered in ED Medications  acetaminophen (TYLENOL) tablet 650 mg (650 mg Oral Given 10/16/20 2101)  ibuprofen (ADVIL) tablet 600 mg (600 mg Oral Given 10/16/20 2101)  lidocaine (PF) (XYLOCAINE) 1 % injection 5 mL (5 mLs Infiltration Given by Other 10/16/20 2102)    ED Course  I have reviewed the triage vital signs and the nursing notes.  Pertinent labs & imaging results that were available during my care of the patient were reviewed by me and considered in my medical decision making (see chart for details).    MDM Rules/Calculators/A&P                           Patient presents with cellulitis and abscess to the right knee.  This was drained and scant amount of purulent material expressed.  Wound was packed.  Will give prescription of antibiotics, advised follow-up with primary care physician within the week and return in 2 to 3 days for wound check.  Patient expressed  understanding.  Discharged home stable condition.  Final Clinical Impression(s) / ED Diagnoses Final diagnoses:  Abscess    Rx / DC Orders ED Discharge Orders          Ordered    sulfamethoxazole-trimethoprim (BACTRIM DS) 800-160 MG tablet  2 times daily        10/16/20 2159             Cheryll Cockayne, MD 10/16/20 2159

## 2020-10-16 NOTE — Discharge Instructions (Addendum)
Keep the abscess covered with a dressing for the next 4 days.  Keep the packing in place.  However the packing falls out do not worry about it.  Return to the ER in 2 days for wound check.  Return immediately if you have fevers increased redness or any additional concerns.

## 2020-10-16 NOTE — ED Notes (Signed)
Pt provided discharge instructions and prescription information. Pt was given the opportunity to ask questions and questions were answered. Discharge signature not obtained in the setting of the COVID-19 pandemic in order to reduce high touch surfaces.  ° °

## 2020-10-19 ENCOUNTER — Other Ambulatory Visit: Payer: Self-pay

## 2020-10-19 ENCOUNTER — Encounter (HOSPITAL_BASED_OUTPATIENT_CLINIC_OR_DEPARTMENT_OTHER): Payer: Self-pay | Admitting: Emergency Medicine

## 2020-10-19 ENCOUNTER — Emergency Department (HOSPITAL_BASED_OUTPATIENT_CLINIC_OR_DEPARTMENT_OTHER)
Admission: EM | Admit: 2020-10-19 | Discharge: 2020-10-19 | Disposition: A | Discharge status: Home / Self Care | Payer: Self-pay | Attending: Emergency Medicine | Admitting: Emergency Medicine

## 2020-10-19 DIAGNOSIS — Z48817 Encounter for surgical aftercare following surgery on the skin and subcutaneous tissue: Secondary | ICD-10-CM | POA: Insufficient documentation

## 2020-10-19 DIAGNOSIS — Z09 Encounter for follow-up examination after completed treatment for conditions other than malignant neoplasm: Secondary | ICD-10-CM

## 2020-10-19 DIAGNOSIS — F17210 Nicotine dependence, cigarettes, uncomplicated: Secondary | ICD-10-CM | POA: Insufficient documentation

## 2020-10-19 NOTE — Discharge Instructions (Addendum)
Take the antibiotics as prescribed.  Continue using warm compresses for pain and inflammation.  Use tylenol and ibuprofen as needed for pain.  Return to the ER with any new, worsening, or concerning symptoms.

## 2020-10-19 NOTE — ED Triage Notes (Signed)
Pt arrives pov, return to have wound packing removed

## 2020-10-19 NOTE — ED Provider Notes (Signed)
[facility] EMERGENCY DEPARTMENT Provider Note   CSN: [id number] Arrival date & time: 10/19/20  1423     History Chief Complaint  Patient presents with   Wound Check    Seth Hill is a 25 y.o. male presenting for evaluation of wound check.  Patient states he was in the ER last week with infection of his right anterior knee.  He had an I&D performed, states since then symptoms have been much improved.  He continues to have mild redness, tenderness, swelling, although this is all improving.  Last night when he removed the dressing the packing came out on its own.  He has not been able to take the antibiotics that were prescribed to him.  He is not taken anything for his symptoms.  HPI     Past Medical History:  Diagnosis Date   Attention deficit disorder with hyperactivity(314.01)    Depression    Fracture, skull (HCC)    ~ 4-5 y/o    Patient Active Problem List   Diagnosis Date Noted   Anxiety and depression 03/30/2019   PCP NOTES >>>>>>>>>>>>>> 09/18/2015   Annual physical exam 12/13/2012   ADHD (attention deficit hyperactivity disorder) 12/13/2012    Past Surgical History:  Procedure Laterality Date   MANDIBLE SURGERY  2008   jaw Fx        Family History  Problem Relation Age of Onset   Macular degeneration Father    Diabetes Father    Hyperlipidemia Father    Brain cancer Paternal Grandfather        brain tumor   CAD Neg Hx     Social History   Tobacco Use   Smoking status: Every Day    Types: Cigarettes   Smokeless tobacco: Never  Vaping Use   Vaping Use: Never used  Substance Use Topics   Alcohol use: No    Alcohol/week: 0.0 standard drinks   Drug use: Yes    Types: Marijuana    Home Medications Prior to Admission medications   Medication Sig Start Date End Date Taking? Authorizing Provider  escitalopram (LEXAPRO) 10 MG tablet Take 1 tablet (10 mg total) by mouth daily. Patient not taking: No sig reported 03/28/19   Wanda Plump, MD  hydrOXYzine (ATARAX/VISTARIL) 25 MG tablet Take 1 tablet (25 mg total) by mouth at bedtime as needed. Patient not taking: No sig reported 03/28/19   Wanda Plump, MD  ondansetron (ZOFRAN ODT) 4 MG disintegrating tablet Take 1 tablet (4 mg total) by mouth every 8 (eight) hours as needed for nausea or vomiting. 09/13/19   Sandford Craze, NP  sulfamethoxazole-trimethoprim (BACTRIM DS) 800-160 MG tablet Take 1 tablet by mouth 2 (two) times daily for 7 days. 10/16/20 10/23/20  Cheryll Cockayne, MD    Allergies    Patient has no known allergies.  Review of Systems   Review of Systems  Constitutional:  Negative for fever.  Skin:  Positive for wound.   Physical Exam Updated Vital Signs BP (!) 128/92 (BP Location: Left Arm)   Pulse 81   Temp 98 F (36.7 C) (Oral)   Resp 18   SpO2 100%   Physical Exam Vitals and nursing note reviewed.  Constitutional:      General: He is not in acute distress.    Appearance: He is well-developed.  HENT:     Head: Normocephalic and atraumatic.  Eyes:     Extraocular Movements: Extraocular movements intact.  Cardiovascular:  Rate and Rhythm: Normal rate.  Pulmonary:     Effort: Pulmonary effort is normal.  Abdominal:     General: There is no distension.  Musculoskeletal:        General: Normal range of motion.     Cervical back: Normal range of motion.     Comments: See picture below.  Right anterior knee with healing abscess with surrounding erythema and warmth.  No significant induration.  Full active range of motion of the knee.  Skin:    General: Skin is warm.     Findings: No rash.  Neurological:     Mental Status: He is alert and oriented to person, place, and time.       ED Results / Procedures / Treatments   Labs (all labs ordered are listed, but only abnormal results are displayed) Labs Reviewed - No data to display  EKG None  Radiology No results found.  Procedures Procedures   Medications Ordered in  ED Medications - No data to display  ED Course  I have reviewed the triage vital signs and the nursing notes.  Pertinent labs & imaging results that were available during my care of the patient were reviewed by me and considered in my medical decision making (see chart for details).    MDM Rules/Calculators/A&P                           Patient presenting for wound check.  On exam, patient appears nontoxic.  Wound appears to be healing well.  Packing was removed by patient last night.  Discussed continued symptomatic management.  Encourage patient to start the antibiotics that were prescribed to him.  At this time, patient appears safe for discharge.  Return precautions given.  Patient states he understands and agrees to plan  Final Clinical Impression(s) / ED Diagnoses Final diagnoses:  Encounter for recheck of abscess following incision and drainage    Rx / DC Orders ED Discharge Orders     None        Alveria Apley, PA-C 10/19/20 1521    Long, Arlyss Repress, MD 10/20/20 [phone]

## 2020-11-09 ENCOUNTER — Emergency Department (HOSPITAL_BASED_OUTPATIENT_CLINIC_OR_DEPARTMENT_OTHER)
Admission: EM | Admit: 2020-11-09 | Discharge: 2020-11-09 | Disposition: A | Discharge status: Home / Self Care | Payer: Self-pay | Attending: Emergency Medicine | Admitting: Emergency Medicine

## 2020-11-09 ENCOUNTER — Other Ambulatory Visit: Payer: Self-pay

## 2020-11-09 ENCOUNTER — Encounter (HOSPITAL_BASED_OUTPATIENT_CLINIC_OR_DEPARTMENT_OTHER): Payer: Self-pay | Admitting: Emergency Medicine

## 2020-11-09 DIAGNOSIS — F17210 Nicotine dependence, cigarettes, uncomplicated: Secondary | ICD-10-CM | POA: Insufficient documentation

## 2020-11-09 DIAGNOSIS — W2209XA Striking against other stationary object, initial encounter: Secondary | ICD-10-CM | POA: Insufficient documentation

## 2020-11-09 DIAGNOSIS — Z23 Encounter for immunization: Secondary | ICD-10-CM | POA: Insufficient documentation

## 2020-11-09 DIAGNOSIS — L0291 Cutaneous abscess, unspecified: Secondary | ICD-10-CM

## 2020-11-09 DIAGNOSIS — L02511 Cutaneous abscess of right hand: Secondary | ICD-10-CM | POA: Insufficient documentation

## 2020-11-09 MED ORDER — DOXYCYCLINE HYCLATE 100 MG PO CAPS: 100.0000 mg | ORAL_CAPSULE | Freq: Two times a day (BID) | ORAL | 0 refills | Status: AC

## 2020-11-09 MED ORDER — BACITRACIN ZINC 500 UNIT/GM EX OINT: TOPICAL_OINTMENT | Freq: Two times a day (BID) | CUTANEOUS | Status: DC

## 2020-11-09 MED ORDER — TETANUS-DIPHTH-ACELL PERTUSSIS 5-2.5-18.5 LF-MCG/0.5 IM SUSY
0.5000 mL | PREFILLED_SYRINGE | Freq: Once | INTRAMUSCULAR | Status: AC
  Administered 2020-11-09: 0.5 mL via INTRAMUSCULAR
  Filled 2020-11-09: qty 0.5

## 2020-11-09 MED ORDER — LIDOCAINE HCL (PF) 1 % IJ SOLN
30.0000 mL | Freq: Once | INTRAMUSCULAR | Status: AC
  Administered 2020-11-09: 5 mL
  Filled 2020-11-09: qty 30

## 2020-11-09 MED ORDER — MUPIROCIN CALCIUM 2 % NA OINT: TOPICAL_OINTMENT | NASAL | 0 refills | Status: AC

## 2020-11-09 NOTE — ED Provider Notes (Signed)
[facility] EMERGENCY DEPARTMENT Provider Note   CSN: [id number] Arrival date & time: 11/09/20  1540     History Chief Complaint  Patient presents with   Wound Infection    Seth Hill is a 25 y.o. male who reports a scratch on his right thumb knuckle a week or so ago.  Patient noticed that an infection started to form, he has not bandaged or applied any antibacterial ointment, however he has been trying to use warm compresses but has not noticed any drainage.  Yesterday he tried opening the wound himself with a knife expressing pus, however nothing drained out.  Patient has a history of skin infections, was just here earlier this year for infection on his knee, also endorses purulent infections on his face and axilla.  Patient reports he initially got the injury from a scratch on a steel beam, does not know when his most recent tetanus booster was.  Patient denies fever, chills, night sweats.  HPI     Past Medical History:  Diagnosis Date   Attention deficit disorder with hyperactivity(314.01)    Depression    Fracture, skull (HCC)    ~ 4-5 y/o    Patient Active Problem List   Diagnosis Date Noted   Anxiety and depression 03/30/2019   PCP NOTES >>>>>>>>>>>>>> 09/18/2015   Annual physical exam 12/13/2012   ADHD (attention deficit hyperactivity disorder) 12/13/2012    Past Surgical History:  Procedure Laterality Date   MANDIBLE SURGERY  2008   jaw Fx        Family History  Problem Relation Age of Onset   Macular degeneration Father    Diabetes Father    Hyperlipidemia Father    Brain cancer Paternal Grandfather        brain tumor   CAD Neg Hx     Social History   Tobacco Use   Smoking status: Every Day    Types: Cigarettes   Smokeless tobacco: Never  Vaping Use   Vaping Use: Never used  Substance Use Topics   Alcohol use: No    Alcohol/week: 0.0 standard drinks   Drug use: Yes    Types: Marijuana    Home Medications Prior to  Admission medications   Medication Sig Start Date End Date Taking? Authorizing Provider  doxycycline (VIBRAMYCIN) 100 MG capsule Take 1 capsule (100 mg total) by mouth 2 (two) times daily for 10 days. 11/09/20 11/19/20 Yes Creola Krotz H, PA-C  mupirocin nasal ointment (BACTROBAN) 2 % Apply in each nostril daily 11/09/20  Yes Senora Lacson H, PA-C  escitalopram (LEXAPRO) 10 MG tablet Take 1 tablet (10 mg total) by mouth daily. Patient not taking: No sig reported 03/28/19   Wanda Plump, MD  hydrOXYzine (ATARAX/VISTARIL) 25 MG tablet Take 1 tablet (25 mg total) by mouth at bedtime as needed. Patient not taking: No sig reported 03/28/19   Wanda Plump, MD  ondansetron (ZOFRAN ODT) 4 MG disintegrating tablet Take 1 tablet (4 mg total) by mouth every 8 (eight) hours as needed for nausea or vomiting. 09/13/19   Sandford Craze, NP    Allergies    Patient has no known allergies.  Review of Systems   Review of Systems  Skin:  Positive for wound.  All other systems reviewed and are negative.  Physical Exam Updated Vital Signs BP 121/84 (BP Location: Left Arm)   Pulse 74   Temp 98.7 F (37.1 C) (Oral)   Resp 20   Ht 5\' 11"  (  1.803 m)   Wt 56.7 kg   SpO2 97%   BMI 17.43 kg/m   Physical Exam Vitals and nursing note reviewed.  Constitutional:      General: He is not in acute distress.    Appearance: Normal appearance.  HENT:     Head: Normocephalic and atraumatic.  Eyes:     General:        Right eye: No discharge.        Left eye: No discharge.  Cardiovascular:     Rate and Rhythm: Normal rate and regular rhythm.     Comments: Radial and ulnar pulses intact bilateral upper extremities Pulmonary:     Effort: Pulmonary effort is normal. No respiratory distress.  Musculoskeletal:        General: No deformity.  Skin:    General: Skin is warm and dry.     Capillary Refill: Capillary refill takes less than 2 seconds. Capillary refill intact distal to area of injury on right  thumb.    Comments: Approximately 3 to 4 cm area of erythema, swelling, fluctuance overlying extensor surface of right thumb.  No linear erythema extending proximally or distally from the area of injury.  No erythema, tenderness on the flexor surface.  Flexion of phalanx is severely limited due to swelling and pain, able to to extend fully.  Neurological:     Mental Status: He is alert and oriented to person, place, and time.  Psychiatric:        Mood and Affect: Mood normal.        Behavior: Behavior normal.       ED Results / Procedures / Treatments   Labs (all labs ordered are listed, but only abnormal results are displayed) Labs Reviewed - No data to display  EKG None  Radiology No results found.  Procedures .Marland KitchenIncision and Drainage  Date/Time: 11/09/2020 5:07 PM Performed by: Olene Floss, PA-C Authorized by: Olene Floss, PA-C   Consent:    Consent obtained:  Verbal   Consent given by:  Patient   Risks, benefits, and alternatives were discussed: yes     Risks discussed:  Bleeding and pain   Alternatives discussed:  No treatment Universal protocol:    Procedure explained and questions answered to patient or proxy's satisfaction: yes     Patient identity confirmed:  Verbally with patient Location:    Type:  Abscess   Location:  Upper extremity   Upper extremity location:  Finger   Finger location:  R thumb Pre-procedure details:    Skin preparation:  Antiseptic wash Sedation:    Sedation type:  None Anesthesia:    Anesthesia method:  Nerve block   Block location:  Right thumb, circumferential   Block needle gauge:  25 G   Block anesthetic:  Lidocaine 1% w/o epi   Block injection procedure:  Anatomic landmarks identified, introduced needle and negative aspiration for blood   Block outcome:  Anesthesia achieved Procedure type:    Complexity:  Simple Procedure details:    Ultrasound guidance: no     Needle aspiration: no     Incision types:   Stab incision   Incision depth:  Dermal   Wound management:  Probed and deloculated   Drainage:  Bloody and purulent   Drainage amount:  Scant   Wound treatment:  Wound left open   Packing materials:  None Post-procedure details:    Procedure completion:  Tolerated   Medications Ordered in ED Medications  bacitracin ointment (  has no administration in time range)  lidocaine (PF) (XYLOCAINE) 1 % injection 30 mL (5 mLs Infiltration Given 11/09/20 1616)  Tdap (BOOSTRIX) injection 0.5 mL (0.5 mLs Intramuscular Given 11/09/20 1616)    ED Course  I have reviewed the triage vital signs and the nursing notes.  Pertinent labs & imaging results that were available during my care of the patient were reviewed by me and considered in my medical decision making (see chart for details).    MDM Rules/Calculators/A&P                         I discussed this case with my attending physician who cosigned this note including patient's presenting symptoms, physical exam, and planned diagnostics and interventions. Attending physician stated agreement with plan or made changes to plan which were implemented.   Attending physician assessed patient at bedside.  Patient without tenderness distal or proximal to the site of injury along the extensor or flexor tendons.  Patient's range of motion is limited, however favored due to swelling and pain.  Noted a large area of fluid collection fluctuance that appears to require drainage.  Proceeded with drainage and was able to drain a small amount of purulent, sanguinous drainage.  Incision site left open to allow for further drainage, and wound bandage with antibacterial ointment prior to discharge.  Will prescribe doxycycline, to cover cellulitis and MRSA.  Patient tolerated procedure without difficulty, was neurovascularly intact before and after the procedure.  Tetanus updated, as it has been 6 to 7 years since he last received it according to patient.  Discussed as  this is a recurrent problem, recommend patient keep future wounds covered and use topical antibacterial ointment to prevent future infections.  Also will prescribe nasal mupirocin for patient to use daily to try to decrease colonization.  Recommend patient follow-up to check for resolution in 2 to 3 days with [place], or with PCP.  Extensive return precautions given, patient and mother state their understanding. Final Clinical Impression(s) / ED Diagnoses Final diagnoses:  Abscess    Rx / DC Orders ED Discharge Orders          Ordered    doxycycline (VIBRAMYCIN) 100 MG capsule  2 times daily        11/09/20 1648    mupirocin nasal ointment (BACTROBAN) 2 %        11/09/20 1648             Ariyanna Oien, [place], PA-C 11/09/20 1719    Pollyann Savoy, MD 11/09/20 1910

## 2020-11-09 NOTE — Discharge Instructions (Addendum)
Please take the antibiotics that I sent to your pharmacy.  Please apply the mupirocin intranasally into your abdomen to daily to help prevent future infections.  Please bandage the site with antibacterial ointment daily as we discussed.  I recommend following up with either [place], or your primary care doctor in 2 to 3 days to ensure that the thumb is improving.  If the pain, swelling, redness worsens, please come back for reevaluation sooner.   Please use Tylenol or ibuprofen for pain.  You may use 600 mg ibuprofen every 6 hours or 1000 mg of Tylenol every 6 hours.  You may choose to alternate between the 2.  This would be most effective.  Not to exceed 4 g of Tylenol within 24 hours.  Not to exceed 3200 mg ibuprofen 24 hours.

## 2020-11-09 NOTE — ED Notes (Signed)
ED Provider at bedside. 

## 2020-11-09 NOTE — ED Triage Notes (Signed)
Pt cut RT thumb at work 1 wk ago; significant swelling and redness noted

## 2020-11-09 NOTE — ED Notes (Signed)
Dressing applied. 

## 2021-05-15 ENCOUNTER — Encounter: Payer: Self-pay | Admitting: Internal Medicine

## 2021-07-28 IMAGING — DX DG CHEST 2V
2 series · 2 of 2 positions shown · non-contrast
Comparison: None.

CLINICAL DATA: Sinusitis.  Bronchitis.

EXAM:
CHEST - 2 VIEW

[chest pa]
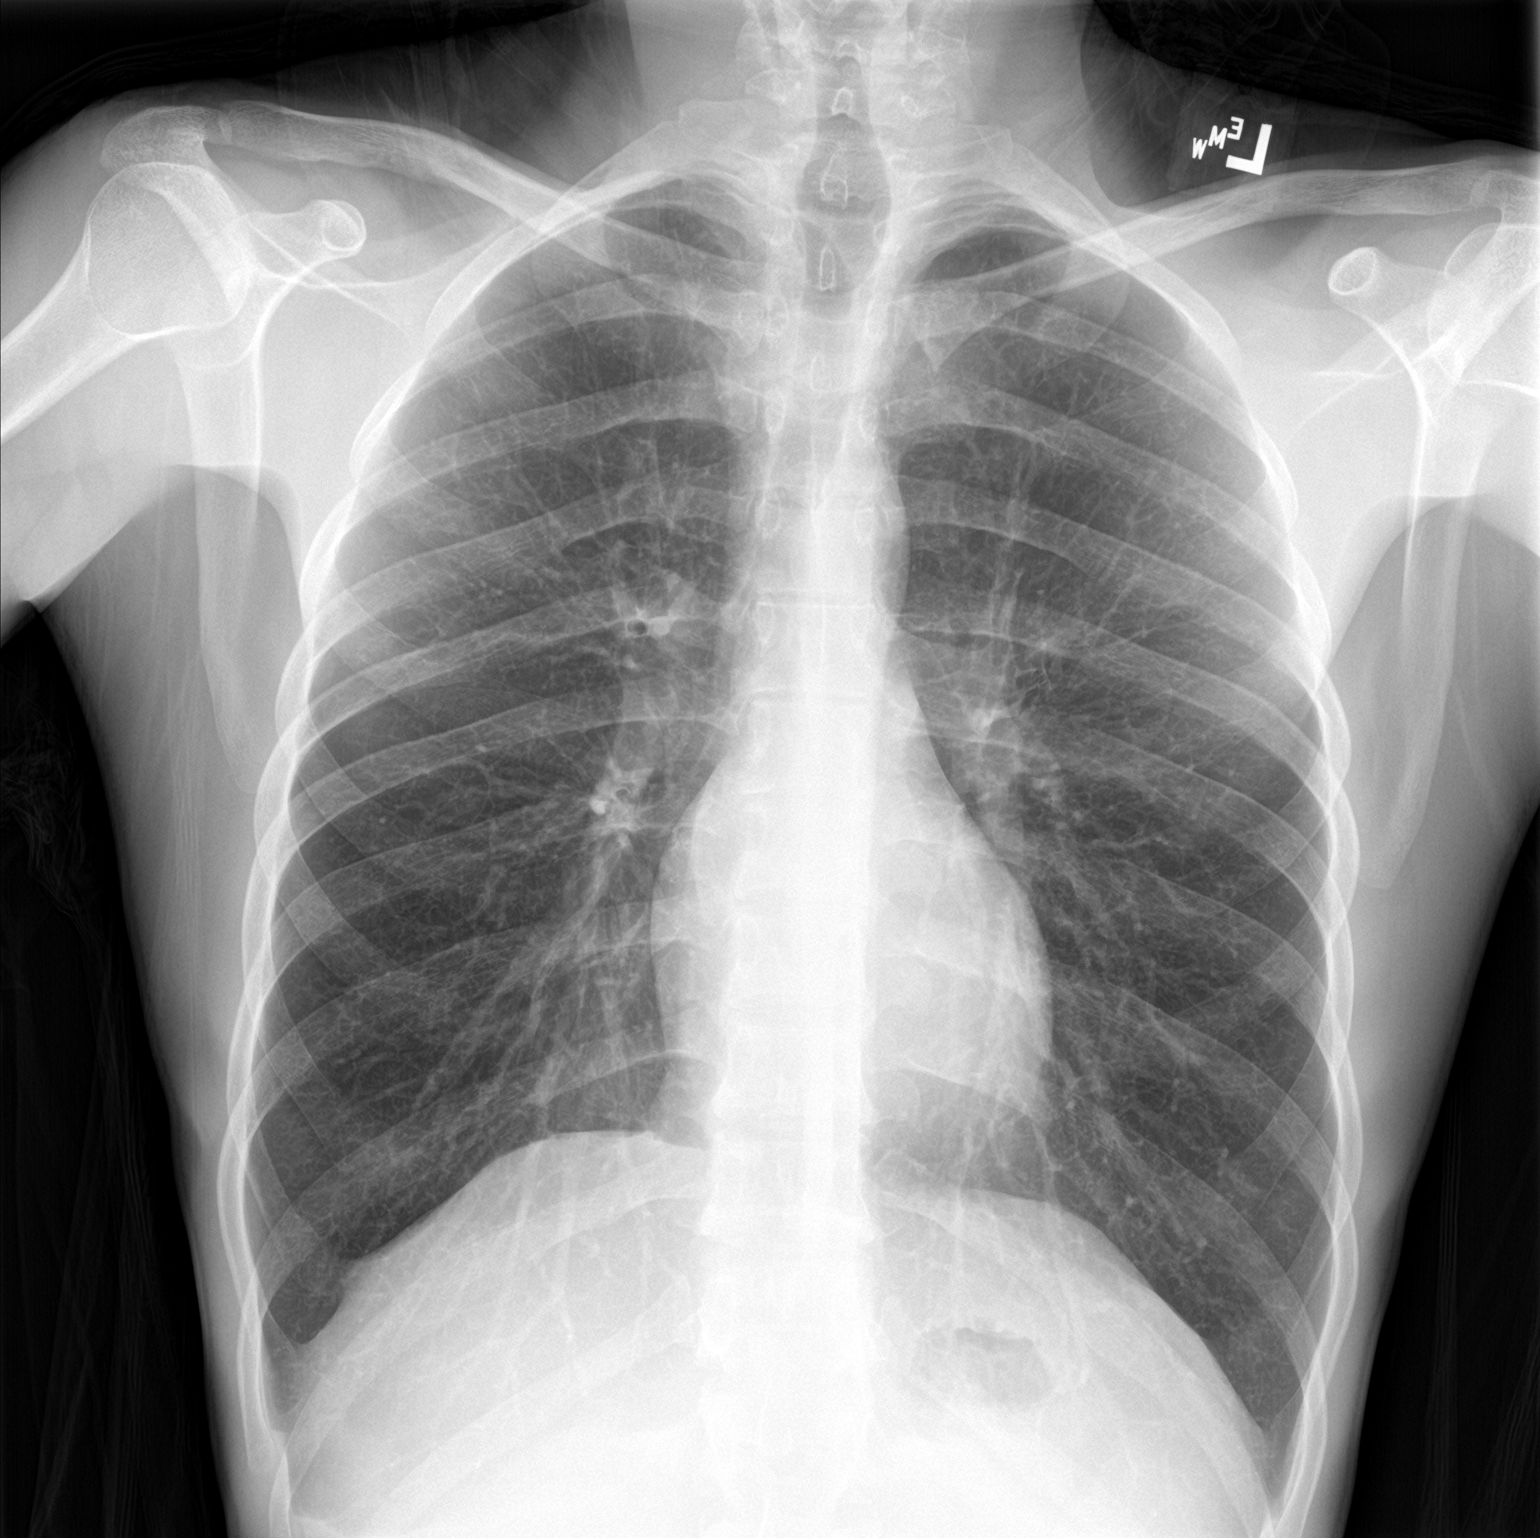

[chest lat]
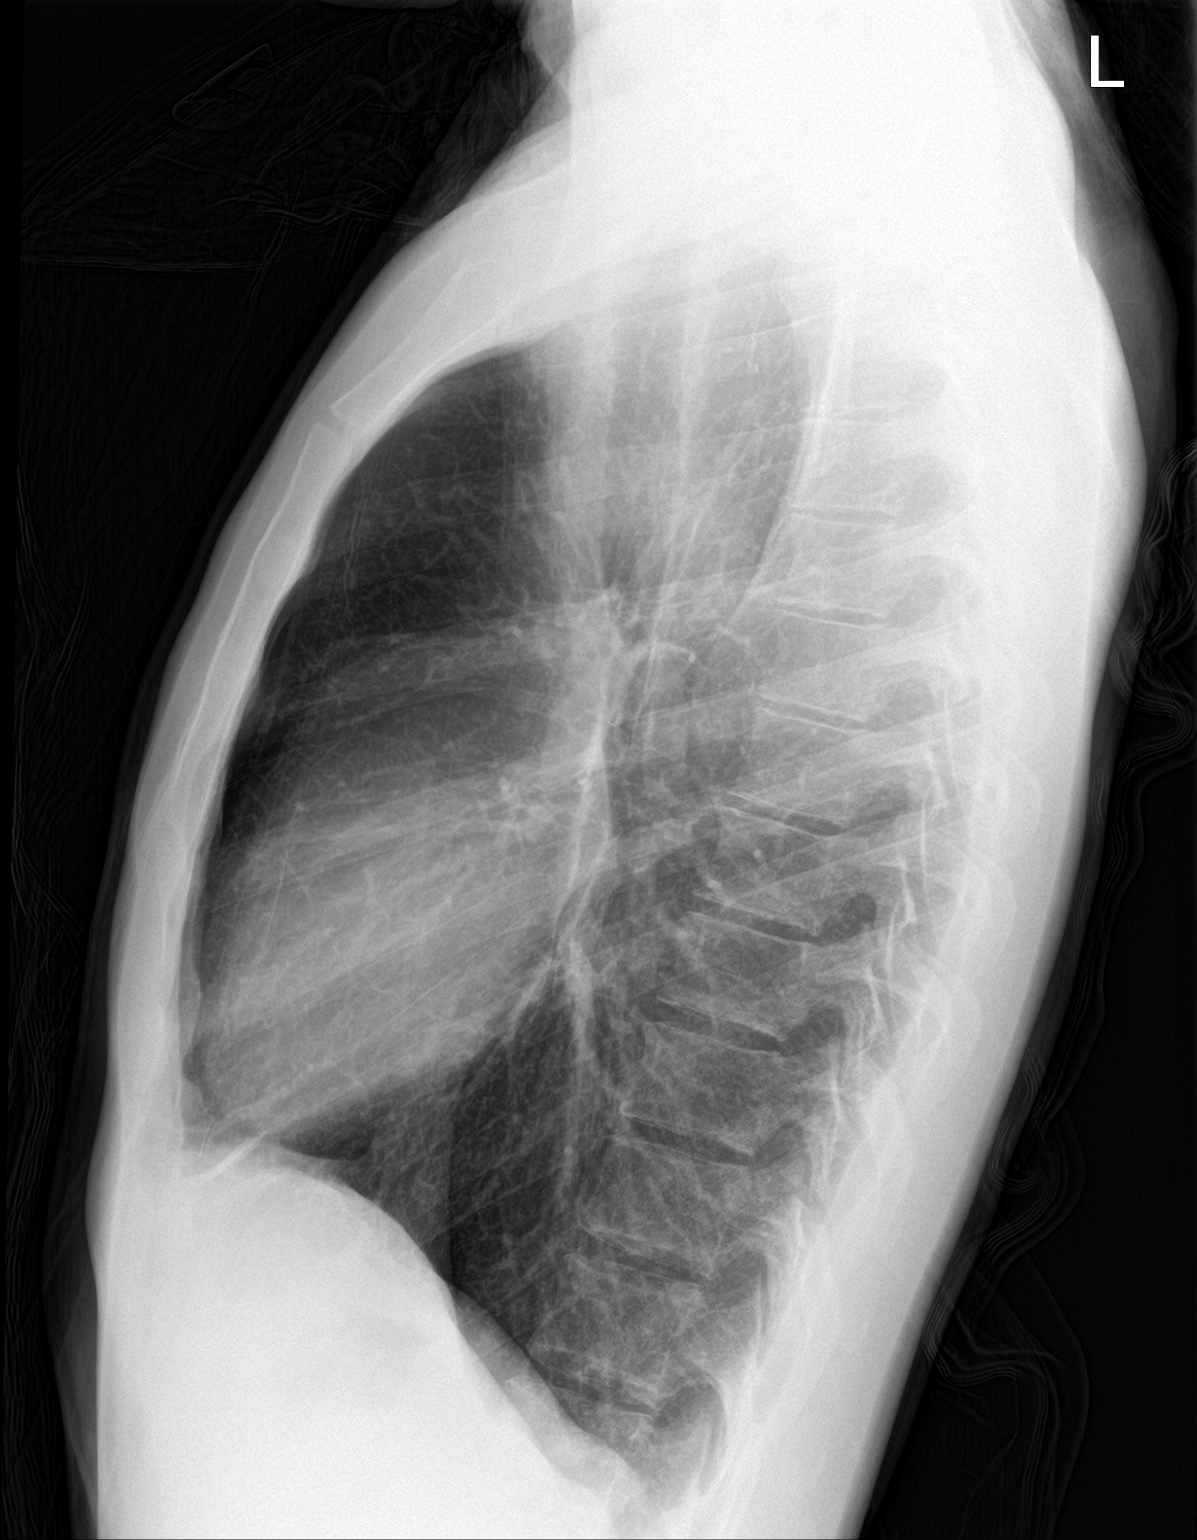

[2 of 2 positions shown; findings below may reference images not displayed]

FINDINGS: The heart size and mediastinal contours are within normal limits.
Both lungs are clear. The visualized skeletal structures are
unremarkable.
IMPRESSION: No active cardiopulmonary disease.

## 2022-08-04 ENCOUNTER — Emergency Department (HOSPITAL_BASED_OUTPATIENT_CLINIC_OR_DEPARTMENT_OTHER)
Admission: EM | Admit: 2022-08-04 | Discharge: 2022-08-04 | Disposition: A | Discharge status: Home / Self Care | Payer: Self-pay | Attending: Emergency Medicine | Admitting: Emergency Medicine

## 2022-08-04 ENCOUNTER — Other Ambulatory Visit: Payer: Self-pay

## 2022-08-04 ENCOUNTER — Encounter (HOSPITAL_BASED_OUTPATIENT_CLINIC_OR_DEPARTMENT_OTHER): Payer: Self-pay | Admitting: Emergency Medicine

## 2022-08-04 ENCOUNTER — Emergency Department (HOSPITAL_BASED_OUTPATIENT_CLINIC_OR_DEPARTMENT_OTHER): Payer: Self-pay

## 2022-08-04 ENCOUNTER — Other Ambulatory Visit (HOSPITAL_BASED_OUTPATIENT_CLINIC_OR_DEPARTMENT_OTHER): Payer: Self-pay

## 2022-08-04 DIAGNOSIS — W228XXA Striking against or struck by other objects, initial encounter: Secondary | ICD-10-CM | POA: Insufficient documentation

## 2022-08-04 DIAGNOSIS — Y990 Civilian activity done for income or pay: Secondary | ICD-10-CM | POA: Insufficient documentation

## 2022-08-04 DIAGNOSIS — M20011 Mallet finger of right finger(s): Secondary | ICD-10-CM | POA: Insufficient documentation

## 2022-08-04 DIAGNOSIS — S62656A Nondisplaced fracture of medial phalanx of right little finger, initial encounter for closed fracture: Secondary | ICD-10-CM | POA: Insufficient documentation

## 2022-08-04 MED ORDER — IBUPROFEN 800 MG PO TABS
800.0000 mg | ORAL_TABLET | Freq: Three times a day (TID) | ORAL | 0 refills | Status: AC | PRN
  Filled 2022-08-04: qty 30, 10d supply, fill #0

## 2022-08-04 NOTE — ED Triage Notes (Signed)
Patient arrives ambulatory by POV c/o right pinky finger pain since Friday. While at work patient hit finger with hammer. Having difficulty straightening finger out.

## 2022-08-04 NOTE — ED Provider Notes (Signed)
[place] EMERGENCY DEPARTMENT AT [facility] Provider Note   CSN: [id number] Arrival date & time: 08/04/22  1139     History  Chief Complaint  Patient presents with   Finger Injury    Seth Hill is a 27 y.o. male.  Seth Hill is a 27 year old caucasian male who presents to the ED with chief complaint of finger pain x 4 days. 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 At rest the pain is 4/10, but increases to 7/10 with movement. It is described as a dull ache with occasional sharp pains. He denies bleeding or discharge from the finger and there does not appear to be an open skin wound. The pain has remained the same since the injury occurred and the patient has not taken any OTC medications.   The history is provided by the patient. No language interpreter was used.       Home Medications Prior to Admission medications   Medication Sig Start Date End Date Taking? Authorizing Provider  ibuprofen (ADVIL) 800 MG tablet Take 1 tablet (800 mg total) by mouth every 8 (eight) hours as needed. 08/04/22  Yes Cheron Schaumann K, PA-C  escitalopram (LEXAPRO) 10 MG tablet Take 1 tablet (10 mg total) by mouth daily. Patient not taking: No sig reported 03/28/19   Wanda Plump, MD  hydrOXYzine (ATARAX/VISTARIL) 25 MG tablet Take 1 tablet (25 mg total) by mouth at bedtime as needed. Patient not taking: No sig reported 03/28/19   Wanda Plump, MD  mupirocin nasal ointment Idelle Jo) 2 % Apply in each nostril daily 11/09/20   Prosperi, Christian H, PA-C  ondansetron (ZOFRAN ODT) 4 MG disintegrating tablet Take 1 tablet (4 mg total) by mouth every 8 (eight) hours as needed for nausea or vomiting. 09/13/19   Sandford Craze, NP      Allergies    Patient has no known allergies.    Review of Systems   Review of Systems  Musculoskeletal:  Positive for joint swelling and myalgias.  All other systems reviewed  and are negative.   Physical Exam Updated Vital Signs BP 118/79   Pulse 68   Temp 98 F (36.7 C)   Resp 16   Ht 5\' 11"  (1.803 m)   Wt 56.7 kg   SpO2 100%   BMI 17.43 kg/m  Physical Exam Vitals reviewed.  Constitutional:      Appearance: Normal appearance.  Musculoskeletal:        General: Swelling and tenderness present.     Comments: Swollen tender right 5th finger,  decreased range of motion  nv and ns intact   Skin:    General: Skin is warm.  Neurological:     General: No focal deficit present.     Mental Status: He is alert.  Psychiatric:        Mood and Affect: Mood normal.     ED Results / Procedures / Treatments   Labs (all labs ordered are listed, but only abnormal results are displayed) Labs Reviewed - No data to display  EKG None  Radiology DG Finger Little Right  Result Date: 08/04/2022 CLINICAL DATA:  Hit hand with hammer. Right 5th finger pain after injury 4 days prior. Sitting at interphalangeal joint. EXAM: RIGHT LITTLE FINGER 2+V COMPARISON:  Right wrist and hand radiographs 05/14/2016 FINDINGS: There is oblique linear lucency at the proximal dorsal aspect of the distal phalanx of  the fifth finger indicating an intra-articular acute fracture with 1 mm diastasis. Mild peripheral osteophytosis at the fifth DIP joint. There is also a 1 mm bone density at the far volar aspect of the base of the distal phalanx of the fifth finger on lateral view that may represent a tiny superficial cortical fracture. IMPRESSION: 1. Acute intra-articular fracture of the proximal dorsal aspect of the distal phalanx of the fifth finger with 1 mm diastasis ("mallet finger" fracture at the insertion of the extensor digitorum tendon). 2. Possible tiny 1 mm superficial cortical fracture at the far volar aspect of the base of the distal phalanx of the fifth finger. Electronically Signed   By: Neita Garnet M.D.   On: 08/04/2022 12:42    Procedures Procedures    Medications Ordered  in ED Medications - No data to display  ED Course/ Medical Decision Making/ A&P                             Medical Decision Making Pt injured his finger at work.  Pt has decreaed use of the end of his right little finger   Amount and/or Complexity of Data Reviewed Radiology: ordered.    Details: X-ray in the ED revealed acute intra-articular fracture of the proximal dorsal aspect of the distal phalanx of the fifth finger with 1 mm diastasis, as well as possible tiny 1 mm superficial cortical fracture at the far volar aspect of the base of the distal phalanx of the fifth finger.  Risk Prescription drug management. Risk Details: Pt placed in a splint and advised to follow up with Dr. Izora Ribas  Orthopaedic Hand surgeon for evaluation            Final Clinical Impression(s) / ED Diagnoses Final diagnoses:  Mallet finger of right hand  Closed nondisplaced fracture of middle phalanx of right little finger, initial encounter    Rx / DC Orders ED Discharge Orders          Ordered    ibuprofen (ADVIL) 800 MG tablet  Every 8 hours PRN        08/04/22 1416           An After Visit Summary was printed and given to the patient.    Elson Areas, [place] 08/04/22 1655    Arby Barrette, MD 08/11/22 [phone]

## 2022-08-04 NOTE — ED Notes (Signed)
Reviewed discharge instructions, follow up and medication with pt. Pt states understanding. Splint in place

## 2022-08-13 ENCOUNTER — Other Ambulatory Visit (HOSPITAL_BASED_OUTPATIENT_CLINIC_OR_DEPARTMENT_OTHER): Payer: Self-pay

## 2023-03-17 IMAGING — DX DG KNEE COMPLETE 4+V*R*
4 series · 4 of 4 positions shown · non-contrast
Comparison: None.

CLINICAL DATA: Knee pain swollen

EXAM:
RIGHT KNEE - COMPLETE 4+ VIEW

[knee ap]
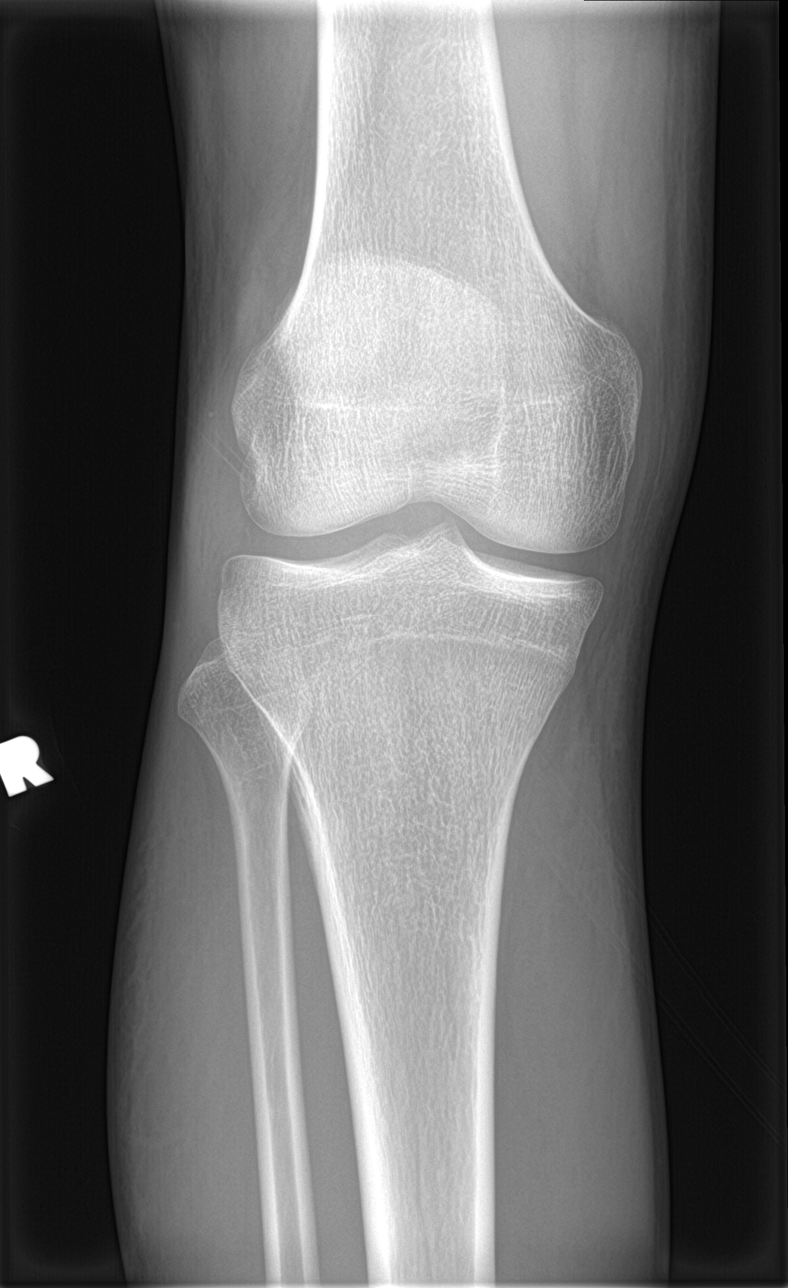

[knee lat]
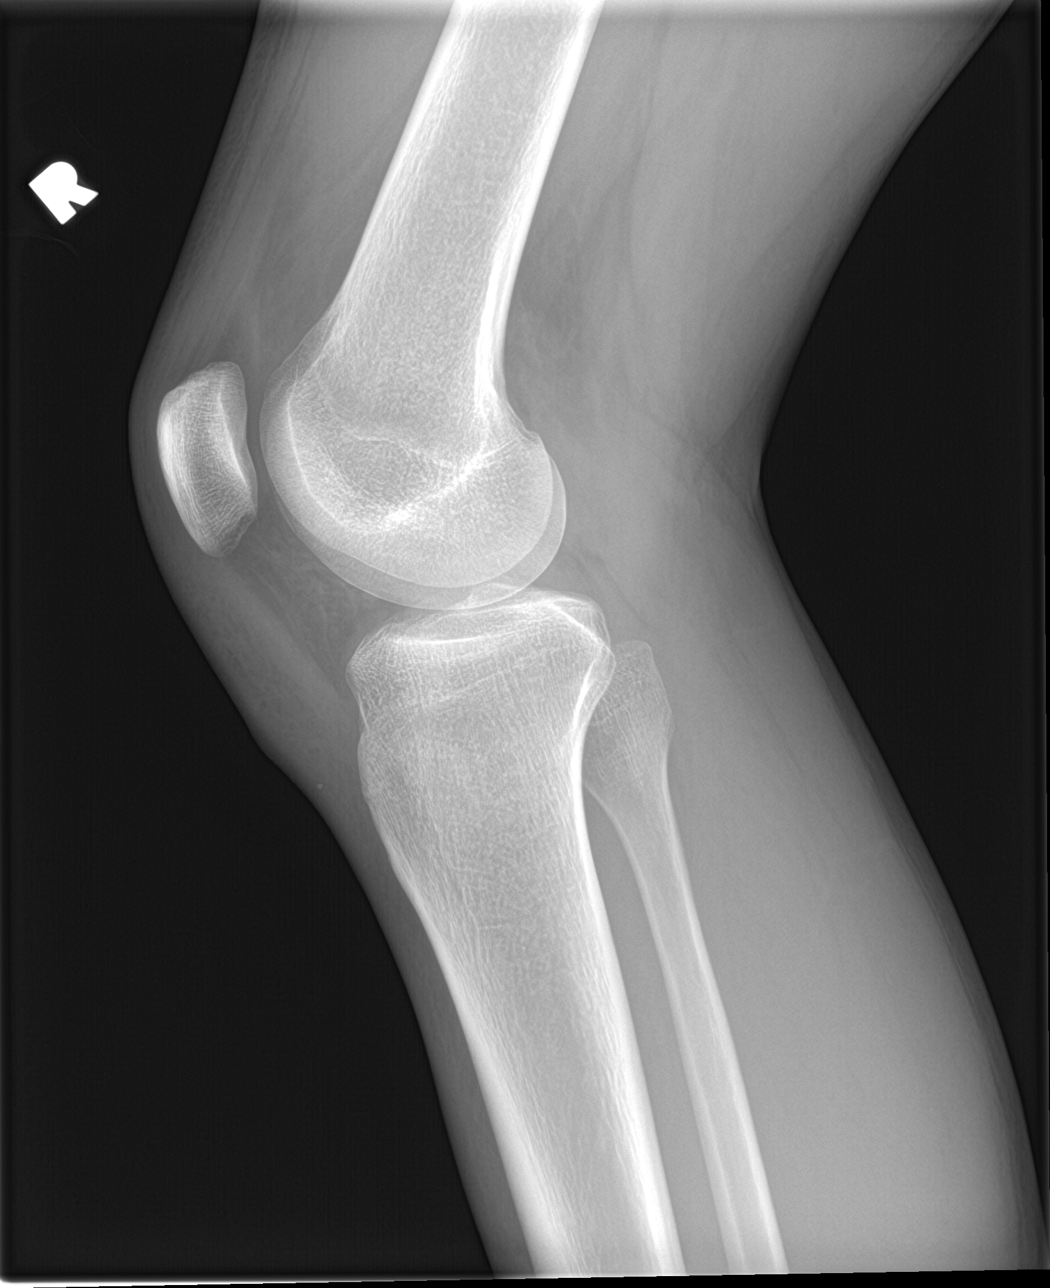

[knee obl (1 of 2)]
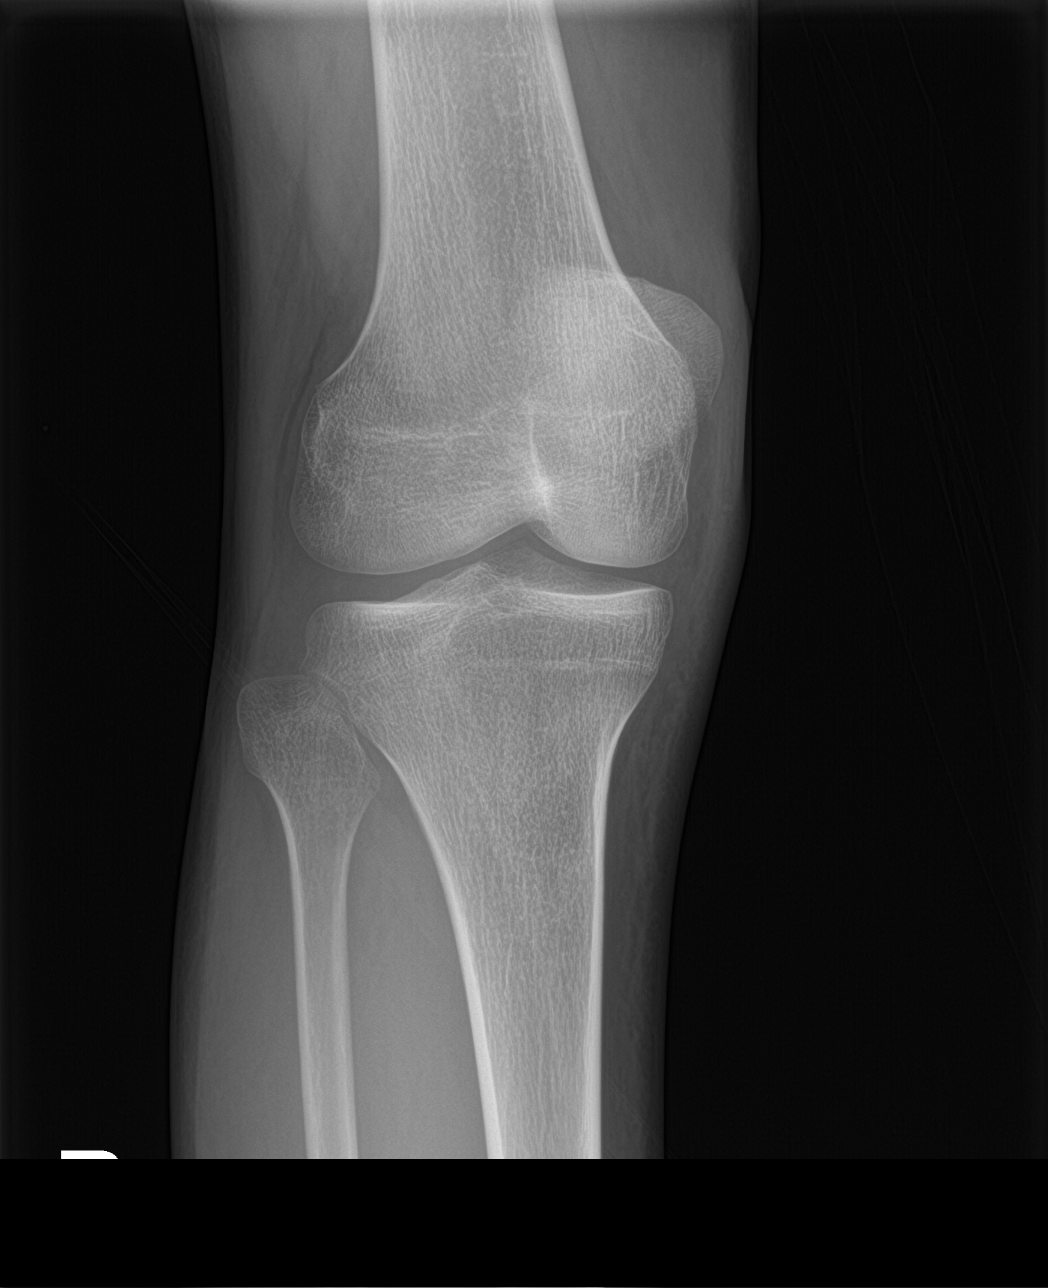

[knee obl (2 of 2)]
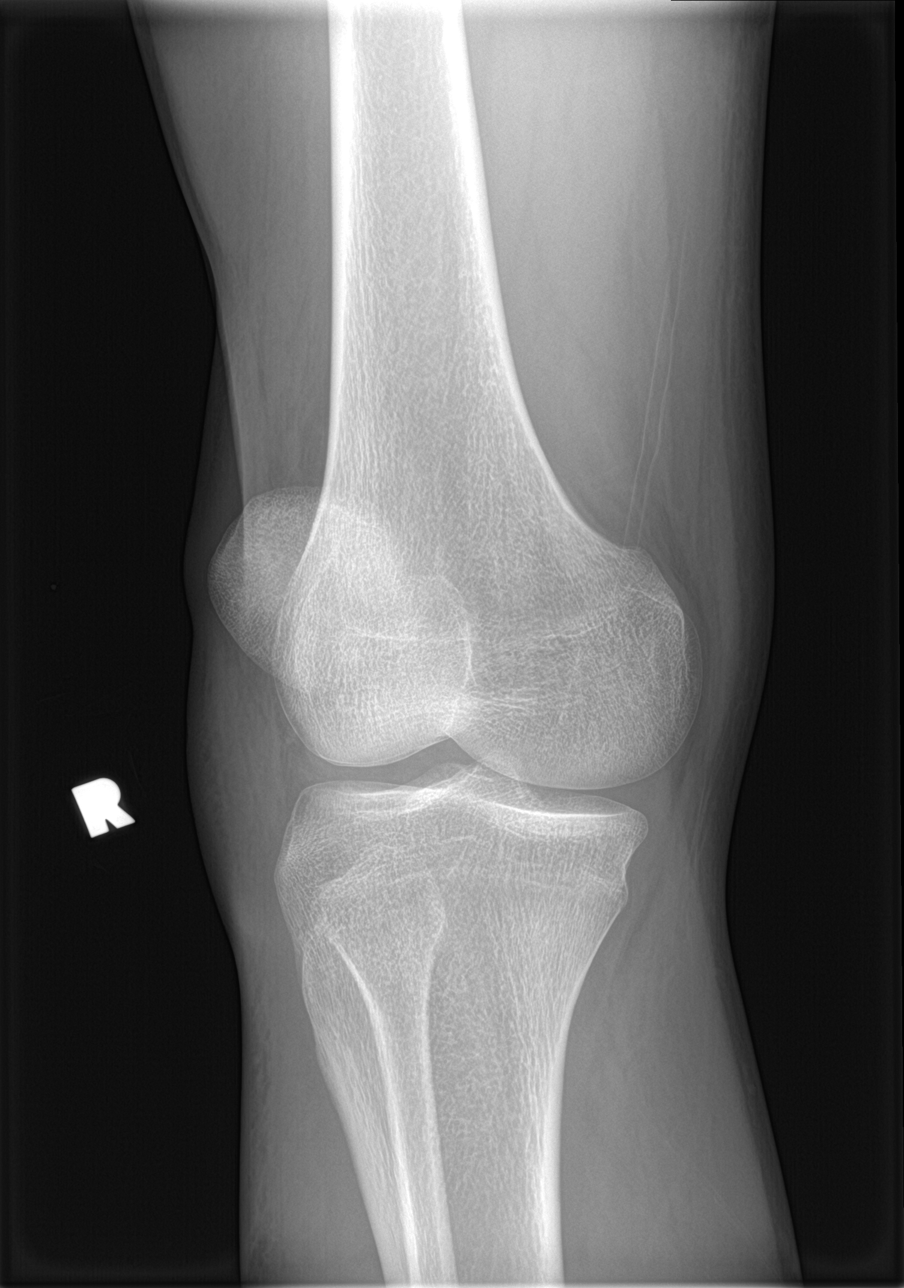

[4 of 4 positions shown; findings below may reference images not displayed]

FINDINGS: No fracture or malalignment. Joint spaces are patent. Anterior knee
swelling. Trace effusion
IMPRESSION: Soft tissue swelling without acute osseous abnormality

## 2024-03-07 ENCOUNTER — Other Ambulatory Visit (HOSPITAL_BASED_OUTPATIENT_CLINIC_OR_DEPARTMENT_OTHER): Payer: Self-pay
